# Patient Record
Sex: Male | Born: 1970 | Race: Black or African American | Hispanic: No | Marital: Single | Smoking: Never smoker
Health system: Southern US, Community
[De-identification: ages and names within clinical notes are randomized; demographics above are authoritative.]

## PROBLEM LIST (undated history)

## (undated) DIAGNOSIS — M109 Gout, unspecified: Secondary | ICD-10-CM

## (undated) DIAGNOSIS — R7303 Prediabetes: Secondary | ICD-10-CM

## (undated) DIAGNOSIS — I10 Essential (primary) hypertension: Secondary | ICD-10-CM

## (undated) DIAGNOSIS — N19 Unspecified kidney failure: Secondary | ICD-10-CM

## (undated) HISTORY — PX: COLONOSCOPY: SHX174

---

## 2013-12-23 ENCOUNTER — Encounter (HOSPITAL_COMMUNITY): Payer: Self-pay | Admitting: Family Medicine

## 2013-12-23 ENCOUNTER — Emergency Department (HOSPITAL_COMMUNITY)
Admission: EM | Admit: 2013-12-23 | Discharge: 2013-12-23 | Disposition: A | Discharge status: Home / Self Care | Payer: No Typology Code available for payment source | Source: Home / Self Care | Attending: Family Medicine | Admitting: Family Medicine

## 2013-12-23 DIAGNOSIS — I10 Essential (primary) hypertension: Secondary | ICD-10-CM

## 2013-12-23 DIAGNOSIS — M2540 Effusion, unspecified joint: Secondary | ICD-10-CM

## 2013-12-23 HISTORY — DX: Essential (primary) hypertension: I10

## 2013-12-23 LAB — COMPREHENSIVE METABOLIC PANEL
ALT: 55 U/L — AB (ref 0–53)
AST: 31 U/L (ref 0–37)
Albumin: 3.9 g/dL (ref 3.5–5.2)
Alkaline Phosphatase: 66 U/L (ref 39–117)
Anion gap: 17 — ABNORMAL HIGH (ref 5–15)
BILIRUBIN TOTAL: 0.5 mg/dL (ref 0.3–1.2)
BUN: 12 mg/dL (ref 6–23)
CHLORIDE: 97 meq/L (ref 96–112)
CO2: 22 mEq/L (ref 19–32)
Calcium: 9.7 mg/dL (ref 8.4–10.5)
Creatinine, Ser: 1.15 mg/dL (ref 0.50–1.35)
GFR, EST AFRICAN AMERICAN: 90 mL/min — AB (ref >90)
GFR, EST NON AFRICAN AMERICAN: 78 mL/min — AB (ref >90)
GLUCOSE: 101 mg/dL — AB (ref 70–99)
Potassium: 4.1 mEq/L (ref 3.7–5.3)
Sodium: 136 mEq/L — ABNORMAL LOW (ref 137–147)
Total Protein: 8.4 g/dL — ABNORMAL HIGH (ref 6.0–8.3)

## 2013-12-23 LAB — URIC ACID: Uric Acid, Serum: 9.4 mg/dL — ABNORMAL HIGH (ref 4.0–7.8)

## 2013-12-23 MED ORDER — PREDNISONE 50 MG PO TABS: ORAL_TABLET | ORAL | Status: DC

## 2013-12-23 MED ORDER — KETOROLAC TROMETHAMINE 60 MG/2ML IM SOLN
INTRAMUSCULAR | Status: AC
  Filled 2013-12-23: qty 2

## 2013-12-23 MED ORDER — KETOROLAC TROMETHAMINE 60 MG/2ML IM SOLN
60.0000 mg | Freq: Once | INTRAMUSCULAR | Status: AC
  Administered 2013-12-23: 60 mg via INTRAMUSCULAR

## 2013-12-23 MED ORDER — HYDROCHLOROTHIAZIDE 25 MG PO TABS: 25.0000 mg | ORAL_TABLET | Freq: Every day | ORAL | Status: DC

## 2013-12-23 NOTE — ED Provider Notes (Addendum)
CSN: [id number]     Arrival date & time 12/23/13  [id number] History   None    Chief Complaint  Patient presents with  . Joint Pain   (Consider location/radiation/quality/duration/timing/severity/associated sxs/prior Treatment) HPI  HTN: Denies CP, SOB, palpitations. Does not take any medications currently. Previously tried 3 different medications    R ankle pain: started 7 days ago. Getting worse. Off and on for several years. Various joints involved (typically wrists or ankles). Toes not involved. Ibuprofen 1200mg  w/ some benefit. Pt works as a Investment banker, operationalchef. Ice w/ benefit. Movement makes it worse.    Past Medical History  Diagnosis Date  . Hypertension    History reviewed. No pertinent past surgical history. History reviewed. No pertinent family history. History  Substance Use Topics  . Smoking status: Never Smoker   . Smokeless tobacco: Not on file  . Alcohol Use: Yes     Comment: occ    Review of Systems Per HPI with all other pertinent systems negative.    Allergies  Review of patient's allergies indicates no known allergies.  Home Medications   Prior to Admission medications   Medication Sig Start Date End Date Taking? Authorizing Provider  hydrochlorothiazide (HYDRODIURIL) 25 MG tablet Take 1 tablet (25 mg total) by mouth daily. 12/23/13   Ozella Rocksavid J Merrell, MD  predniSONE (DELTASONE) 50 MG tablet Take daily with breakfast 12/23/13   Ozella Rocksavid J Merrell, MD   BP 180/106  Pulse 96  Temp(Src) 98.4 F (36.9 C) (Oral)  Resp 18  SpO2 100% Physical Exam  Constitutional: He appears well-developed and well-nourished. No distress.  HENT:  Head: Normocephalic and atraumatic.  Eyes: Pupils are equal, round, and reactive to light.  Neck: Normal range of motion.  Cardiovascular: Normal rate, regular rhythm, normal heart sounds and intact distal pulses.  Exam reveals no gallop and no friction rub.   No murmur heard. Pulmonary/Chest: Effort normal and breath sounds normal. No respiratory  distress. He has no wheezes. He has no rales. He exhibits no tenderness.  Abdominal: Soft. He exhibits no distension.  Musculoskeletal: Normal range of motion.  R ankle swollen and warm to plapation. No fluid collection.   Neurological: He is alert. He exhibits normal muscle tone.  Skin: Skin is warm. No rash noted. He is not diaphoretic.  Psychiatric: He has a normal mood and affect. His behavior is normal. Judgment and thought content normal.    ED Course  Procedures (including critical care time) Labs Review Labs Reviewed  COMPREHENSIVE METABOLIC PANEL  URIC ACID    Imaging Review No results found.   MDM   1. Joint swelling   2. Essential hypertension    Intermittent joint swelling most concerning for gout. Unlikely PMR given locations, unlikely infectious or injury based. Toradol in office today Start 7 days of prednisone in am tomorrow. Favor prednisone over NSAIDs or colchicine given recent high dose of NSAIDs and possible renal implications along w/ HTN.   HTN: uncontrolled. Previously on medications. Sounds like a resistent pattern and will need multiple medications. CMET. Start HCTZ 25mg .   [facility] and discussed other clinic options.   Shelly Flattenavid Merrell, MD Family Medicine 12/23/2013, 8:54 AM      Ozella Rocksavid J Merrell, MD 12/23/13 [id number]  Ozella Rocksavid J Merrell, MD 12/23/13 2149

## 2013-12-23 NOTE — ED Notes (Signed)
C/o  Bilateral ankle, bilateral wrist swelling off/on.   Denies injury. 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    Pt has hx of hypertension but is noncompliant with medication.

## 2013-12-23 NOTE — Discharge Instructions (Signed)
Your joint pain is likely from gout. We will check blood work today to see if this is the cause of your pain The shot we gave you should help siginificantly Please start the prednisone tomorrow Please start the HCTZ for your blood pressure Please call the family medicine center to get an appointment The number is [phone]

## 2013-12-26 NOTE — ED Notes (Signed)
Uric acid 9.4 H.  Message sent to Dr. Konrad DoloresMerrell. Vassie MoselleYork,  M 12/26/2013

## 2013-12-27 ENCOUNTER — Telehealth (HOSPITAL_COMMUNITY): Payer: Self-pay | Admitting: *Deleted

## 2013-12-27 NOTE — ED Notes (Signed)
Dr. Konrad DoloresMerrell asked me to call pt. his results.  I called pt. Pt. verified x 2 and given results.  Pt. told he needs to get a PCP to follow him up for this. He may need medication to lower his uric acid level.  This would help prevent frequent attacks of gout.  Pt. Voiced understanding. Vassie MoselleYork,  M 12/27/2013

## 2014-04-20 ENCOUNTER — Other Ambulatory Visit: Payer: Self-pay | Admitting: Physician Assistant

## 2014-08-11 ENCOUNTER — Other Ambulatory Visit: Payer: Self-pay | Admitting: Physician Assistant

## 2015-08-16 DIAGNOSIS — M109 Gout, unspecified: Secondary | ICD-10-CM | POA: Insufficient documentation

## 2015-08-16 DIAGNOSIS — N183 Chronic kidney disease, stage 3 unspecified: Secondary | ICD-10-CM | POA: Insufficient documentation

## 2015-08-16 DIAGNOSIS — E669 Obesity, unspecified: Secondary | ICD-10-CM | POA: Insufficient documentation

## 2016-02-26 ENCOUNTER — Inpatient Hospital Stay (HOSPITAL_COMMUNITY)
Admission: EM | Admit: 2016-02-26 | Discharge: 2016-03-04 | DRG: 683 | Disposition: A | Discharge status: Home / Self Care | Payer: BLUE CROSS/BLUE SHIELD | Attending: Family Medicine | Admitting: Family Medicine

## 2016-02-26 ENCOUNTER — Encounter (HOSPITAL_COMMUNITY): Payer: Self-pay

## 2016-02-26 ENCOUNTER — Ambulatory Visit (HOSPITAL_COMMUNITY)
Admission: EM | Admit: 2016-02-26 | Discharge: 2016-02-26 | Disposition: A | Discharge status: Nursing Home (Includes ICF) | Payer: BLUE CROSS/BLUE SHIELD | Source: Home / Self Care | Attending: Family Medicine | Admitting: Family Medicine

## 2016-02-26 ENCOUNTER — Emergency Department (HOSPITAL_COMMUNITY): Payer: BLUE CROSS/BLUE SHIELD

## 2016-02-26 ENCOUNTER — Encounter (HOSPITAL_COMMUNITY): Payer: Self-pay | Admitting: Emergency Medicine

## 2016-02-26 ENCOUNTER — Ambulatory Visit (INDEPENDENT_AMBULATORY_CARE_PROVIDER_SITE_OTHER): Payer: BLUE CROSS/BLUE SHIELD

## 2016-02-26 DIAGNOSIS — Z95828 Presence of other vascular implants and grafts: Secondary | ICD-10-CM | POA: Diagnosis not present

## 2016-02-26 DIAGNOSIS — Z79899 Other long term (current) drug therapy: Secondary | ICD-10-CM | POA: Diagnosis not present

## 2016-02-26 DIAGNOSIS — N19 Unspecified kidney failure: Secondary | ICD-10-CM | POA: Diagnosis not present

## 2016-02-26 DIAGNOSIS — B1920 Unspecified viral hepatitis C without hepatic coma: Secondary | ICD-10-CM | POA: Diagnosis present

## 2016-02-26 DIAGNOSIS — R0682 Tachypnea, not elsewhere classified: Secondary | ICD-10-CM | POA: Diagnosis present

## 2016-02-26 DIAGNOSIS — M109 Gout, unspecified: Secondary | ICD-10-CM | POA: Diagnosis present

## 2016-02-26 DIAGNOSIS — R0600 Dyspnea, unspecified: Secondary | ICD-10-CM

## 2016-02-26 DIAGNOSIS — I120 Hypertensive chronic kidney disease with stage 5 chronic kidney disease or end stage renal disease: Secondary | ICD-10-CM | POA: Diagnosis present

## 2016-02-26 DIAGNOSIS — R918 Other nonspecific abnormal finding of lung field: Secondary | ICD-10-CM

## 2016-02-26 DIAGNOSIS — N186 End stage renal disease: Secondary | ICD-10-CM | POA: Diagnosis present

## 2016-02-26 DIAGNOSIS — E875 Hyperkalemia: Secondary | ICD-10-CM | POA: Diagnosis present

## 2016-02-26 DIAGNOSIS — N170 Acute kidney failure with tubular necrosis: Principal | ICD-10-CM | POA: Diagnosis present

## 2016-02-26 DIAGNOSIS — R9431 Abnormal electrocardiogram [ECG] [EKG]: Secondary | ICD-10-CM | POA: Diagnosis not present

## 2016-02-26 DIAGNOSIS — N179 Acute kidney failure, unspecified: Secondary | ICD-10-CM

## 2016-02-26 DIAGNOSIS — Z6830 Body mass index (BMI) 30.0-30.9, adult: Secondary | ICD-10-CM | POA: Diagnosis not present

## 2016-02-26 DIAGNOSIS — E669 Obesity, unspecified: Secondary | ICD-10-CM | POA: Diagnosis present

## 2016-02-26 DIAGNOSIS — E872 Acidosis, unspecified: Secondary | ICD-10-CM

## 2016-02-26 DIAGNOSIS — E1122 Type 2 diabetes mellitus with diabetic chronic kidney disease: Secondary | ICD-10-CM | POA: Diagnosis present

## 2016-02-26 DIAGNOSIS — B1910 Unspecified viral hepatitis B without hepatic coma: Secondary | ICD-10-CM | POA: Diagnosis present

## 2016-02-26 DIAGNOSIS — Z7952 Long term (current) use of systemic steroids: Secondary | ICD-10-CM | POA: Diagnosis not present

## 2016-02-26 DIAGNOSIS — R0602 Shortness of breath: Secondary | ICD-10-CM | POA: Diagnosis not present

## 2016-02-26 DIAGNOSIS — Z66 Do not resuscitate: Secondary | ICD-10-CM | POA: Diagnosis present

## 2016-02-26 DIAGNOSIS — E869 Volume depletion, unspecified: Secondary | ICD-10-CM | POA: Diagnosis present

## 2016-02-26 DIAGNOSIS — R0609 Other forms of dyspnea: Secondary | ICD-10-CM | POA: Diagnosis not present

## 2016-02-26 DIAGNOSIS — Z794 Long term (current) use of insulin: Secondary | ICD-10-CM | POA: Diagnosis not present

## 2016-02-26 HISTORY — DX: Gout, unspecified: M10.9

## 2016-02-26 LAB — BASIC METABOLIC PANEL
Anion gap: 38 — ABNORMAL HIGH (ref 5–15)
BUN: 269 mg/dL — AB (ref 6–20)
BUN: 271 mg/dL — ABNORMAL HIGH (ref 6–20)
CALCIUM: 6.4 mg/dL — AB (ref 8.9–10.3)
CO2: 7 mmol/L — ABNORMAL LOW (ref 22–32)
CREATININE: 21.04 mg/dL — AB (ref 0.61–1.24)
CREATININE: 22.07 mg/dL — AB (ref 0.61–1.24)
Calcium: 6.4 mg/dL — CL (ref 8.9–10.3)
Chloride: 85 mmol/L — ABNORMAL LOW (ref 101–111)
Chloride: 85 mmol/L — ABNORMAL LOW (ref 101–111)
GFR calc Af Amer: 3 mL/min — ABNORMAL LOW (ref >60)
GFR calc Af Amer: 2 mL/min — ABNORMAL LOW (ref >60)
GFR calc non Af Amer: 2 mL/min — ABNORMAL LOW (ref >60)
GFR, EST NON AFRICAN AMERICAN: 2 mL/min — AB (ref >60)
GLUCOSE: 158 mg/dL — AB (ref 65–99)
Glucose, Bld: 151 mg/dL — ABNORMAL HIGH (ref 65–99)
Potassium: 5.6 mmol/L — ABNORMAL HIGH (ref 3.5–5.1)
Potassium: 5.1 mmol/L (ref 3.5–5.1)
SODIUM: 130 mmol/L — AB (ref 135–145)
Sodium: 129 mmol/L — ABNORMAL LOW (ref 135–145)

## 2016-02-26 LAB — COMPREHENSIVE METABOLIC PANEL
ALT: 103 U/L — ABNORMAL HIGH (ref 17–63)
AST: 37 U/L (ref 15–41)
Albumin: 2.6 g/dL — ABNORMAL LOW (ref 3.5–5.0)
Alkaline Phosphatase: 64 U/L (ref 38–126)
BILIRUBIN TOTAL: 0.6 mg/dL (ref 0.3–1.2)
BUN: 272 mg/dL — AB (ref 6–20)
CO2: <7 mmol/L — ABNORMAL LOW (ref 22–32)
Calcium: 6.3 mg/dL — CL (ref 8.9–10.3)
Chloride: 88 mmol/L — ABNORMAL LOW (ref 101–111)
Creatinine, Ser: 21.8 mg/dL — ABNORMAL HIGH (ref 0.61–1.24)
GFR calc Af Amer: 3 mL/min — ABNORMAL LOW (ref >60)
GFR, EST NON AFRICAN AMERICAN: 2 mL/min — AB (ref >60)
Glucose, Bld: 104 mg/dL — ABNORMAL HIGH (ref 65–99)
POTASSIUM: 4.6 mmol/L (ref 3.5–5.1)
Sodium: 130 mmol/L — ABNORMAL LOW (ref 135–145)
TOTAL PROTEIN: 6.9 g/dL (ref 6.5–8.1)

## 2016-02-26 LAB — I-STAT TROPONIN, ED: TROPONIN I, POC: 0.03 ng/mL (ref 0.00–0.08)

## 2016-02-26 LAB — CBC
HCT: 34.6 % — ABNORMAL LOW (ref 39.0–52.0)
Hemoglobin: 11.1 g/dL — ABNORMAL LOW (ref 13.0–17.0)
MCH: 28.6 pg (ref 26.0–34.0)
MCHC: 32.1 g/dL (ref 30.0–36.0)
MCV: 89.2 fL (ref 78.0–100.0)
Platelets: 101 10*3/uL — ABNORMAL LOW (ref 150–400)
RBC: 3.88 MIL/uL — ABNORMAL LOW (ref 4.22–5.81)
RDW: 16.7 % — AB (ref 11.5–15.5)
WBC: 9 10*3/uL (ref 4.0–10.5)

## 2016-02-26 LAB — HEPATIC FUNCTION PANEL
ALT: 111 U/L — ABNORMAL HIGH (ref 17–63)
AST: 40 U/L (ref 15–41)
Albumin: 2.9 g/dL — ABNORMAL LOW (ref 3.5–5.0)
Alkaline Phosphatase: 72 U/L (ref 38–126)
BILIRUBIN DIRECT: 0.1 mg/dL (ref 0.1–0.5)
BILIRUBIN INDIRECT: 0.5 mg/dL (ref 0.3–0.9)
Total Bilirubin: 0.6 mg/dL (ref 0.3–1.2)
Total Protein: 7.8 g/dL (ref 6.5–8.1)

## 2016-02-26 LAB — I-STAT ARTERIAL BLOOD GAS, ED
Acid-base deficit: 21 mmol/L — ABNORMAL HIGH (ref 0.0–2.0)
Bicarbonate: 6.5 mmol/L — ABNORMAL LOW (ref 20.0–28.0)
O2 SAT: 81 %
PH ART: 7.114 — AB (ref 7.350–7.450)
TCO2: 7 mmol/L (ref 0–100)
pCO2 arterial: 20.4 mmHg — ABNORMAL LOW (ref 32.0–48.0)
pO2, Arterial: 58 mmHg — ABNORMAL LOW (ref 83.0–108.0)

## 2016-02-26 LAB — CK
CK TOTAL: 5585 U/L — AB (ref 49–397)
CK TOTAL: 5400 U/L — AB (ref 49–397)

## 2016-02-26 LAB — CBG MONITORING, ED
Glucose-Capillary: 137 mg/dL — ABNORMAL HIGH (ref 65–99)
Glucose-Capillary: 186 mg/dL — ABNORMAL HIGH (ref 65–99)

## 2016-02-26 LAB — MAGNESIUM: Magnesium: 3.2 mg/dL — ABNORMAL HIGH (ref 1.7–2.4)

## 2016-02-26 LAB — PHOSPHORUS: PHOSPHORUS: 19.8 mg/dL — AB (ref 2.5–4.6)

## 2016-02-26 LAB — LACTATE DEHYDROGENASE: LDH: 667 U/L — AB (ref 98–192)

## 2016-02-26 LAB — BRAIN NATRIURETIC PEPTIDE: B Natriuretic Peptide: 187.5 pg/mL — ABNORMAL HIGH (ref 0.0–100.0)

## 2016-02-26 MED ORDER — SODIUM BICARBONATE 8.4 % IV SOLN
50.0000 meq | Freq: Once | INTRAVENOUS | Status: AC
  Administered 2016-02-26: 50 meq via INTRAVENOUS
  Filled 2016-02-26: qty 50

## 2016-02-26 MED ORDER — FUROSEMIDE 10 MG/ML IJ SOLN
160.0000 mg | Freq: Once | INTRAVENOUS | Status: DC
  Filled 2016-02-26: qty 16

## 2016-02-26 MED ORDER — INSULIN ASPART 100 UNIT/ML IV SOLN
10.0000 [IU] | Freq: Once | INTRAVENOUS | Status: AC
  Administered 2016-02-26: 10 [IU] via INTRAVENOUS
  Filled 2016-02-26: qty 1

## 2016-02-26 MED ORDER — SODIUM CHLORIDE 0.9 % IV BOLUS (SEPSIS)
1000.0000 mL | Freq: Once | INTRAVENOUS | Status: AC
  Administered 2016-02-26: 1000 mL via INTRAVENOUS

## 2016-02-26 MED ORDER — DEXTROSE 50 % IV SOLN
50.0000 mL | Freq: Once | INTRAVENOUS | Status: AC
  Administered 2016-02-26: 50 mL via INTRAVENOUS
  Filled 2016-02-26: qty 50

## 2016-02-26 MED ORDER — LIDOCAINE HCL (PF) 1 % IJ SOLN
INTRAMUSCULAR | Status: AC
  Administered 2016-02-26: 30 mL
  Filled 2016-02-26: qty 30

## 2016-02-26 MED ORDER — RENA-VITE PO TABS
1.0000 | ORAL_TABLET | Freq: Every day | ORAL | Status: DC
  Administered 2016-02-27 – 2016-03-03 (×5): 1 via ORAL
  Filled 2016-02-26 (×8): qty 1

## 2016-02-26 MED ORDER — CALCIUM GLUCONATE 10 % IV SOLN
1.0000 g | Freq: Once | INTRAVENOUS | Status: AC
  Administered 2016-02-26: 1 g via INTRAVENOUS
  Filled 2016-02-26: qty 10

## 2016-02-26 NOTE — ED Notes (Signed)
Admitting MD and nephrologist at bedside 

## 2016-02-26 NOTE — ED Notes (Signed)
Pt to dialysis at this time

## 2016-02-26 NOTE — H&P (Signed)
Family Medicine Teaching [facility] Admission History and Physical Service Pager: [phone]  Patient name: Harold Rodriguez Medical record number: [id number] Date of birth: Mar 29, 1971 Age: 45 y.o. Gender: male  Primary Care Provider: [facility] Consultants: nephrology Code Status: DNR  Chief Complaint: dyspnea   Assessment and Plan: Harold Rodriguez is a 45 y.o. male presenting with dysnpea, metabolic acidosis, and acute renal failure. PMH is significant for HTN, gout.   Acute Renal Failure: Cr 21.04 on admission. Phosphorus 19.8. Care everywhere Cr 09/2015 1.25. Past medical history only significant for HTN and gout. Pt reports SBPs in 180s when he checks, but this is quite a rapid change in kidney function from only HTN. Patient was unable to name his blood pressure medications, raising concern about adherence. Extensive family history of dialysis on paternal side, which sister reports is due to diabetes. No personal or family history of autoimmune disease like lupus, sarcoid, or others, although these are certainly on the differential as well. Has reported joint aches, mostly in elbows. Denies drug use, regular NSAID use, and taking supplements. Denies any recent increase in exercise, which could be concerning for rhabdomyolysis causing renal failure. Bladder scan for only 20cc of urine in ED (although patient reports making urine at home), making obstruction at level of bladder unlikely. Patient also denies seeing any blood in urine.  -admit to stepdown, Dr. Randolm Idol attending -nephrology following, appreciate recs: emergent dialysis tonight, likely HD tomorrow as well. Will place fem cath tonight. May need renal biopsy. Obtain renal U/S.  -laboratory workup per nephro includes: C3 and C4 complement, anca, ANA, glomerular basement membrane antibodies, HIV, Hepatitis B and C antibodies, protein electrophoresis, peripheral smear, haptoglobin, LDH.  -trend  CMPs -Obtain UA and Urine protein: Cr -Obtain CK  Shortness of breath:  No history of lung disease. Presented to PCP on 9/19 with this concern, was given prednisone. Given metabolic acidosis in setting of acute kidney failure, patient is likely tachypneic due to excessive acid (BUN 272 on CMP; ABG pH 7.114, pCO2 20.4, bicarb 6.5). Saturating 100% on room air. CXR with scattered peribronchial nodular and ground-glass opacities, more prominent in the right lung. Tachynpea likely caused by acidosis, other possible etiologies include sarcoidosis, pneuomonitis, or pulmonary edema (although not volume overloaded on exam).  -continue to monitor after HD; continue aggressive treatment of uremia -s/p 1 amp bicarb in ED  HTN: Normotensive to slightly hypertensive on admission. Home meds include norvasc and HCTZ.  -PRN hydralazine SBP>160 / DBP >110  Gout: Patient unable to give history of most recent flair. No signs of acute flair at present. Patient reports compliance with this medication. -consider restarting home allopurinol   FEN/GI: renal diet Prophylaxis: lovenox  Disposition: admission to stepdown  History of Present Illness:  Harold Rodriguez is a 45 y.o. male presenting with shortness of breath.   This began 2 weeks ago, he has been generally feeling unwell. Unable to specify further, no sites of pain. Over the past week, this has worsened with patient unable to take anything by mouth very well and having intermittent nausea. Shortness of breath since at least 9/19, when he went to his PCP and was given prednisone, and CBC and chest x-ray were done. Return to urgent care today due to shortness of breath; staff concerned due to tachypnea, so patient was transferred to ED via EMS. Extensive family history of renal failure, patient's sister reports this is due to diabetes. Patient reports extent of medical history  to be hypertension and gout. He reports that his systolic blood pressures have been in  the 180s when he has checked them recently. He is supposed to be on Norvasc and HCTZ, although it's unclear whether the patient is compliant with these or confused about his regimen. Works for [employer], does do manual heavy lifting, no more recently than previous. No new strenuous exercise routines, no new supplements or over-the-counter medicines. Denies fever.  In the ED, hyperkalemic to 5.6, with acute renal failure as evidenced by creatinine of 21.04, hypocalcemia to 6.4. Anion gap 38. I-STAT ABG consistent with compensated metabolic acidosis pH 7.11, PCO2 20.4, bicarbonate 6.5. Troponin normal at 0.03. CK ordered. Patient given calcium gluconate and insulin for hyperkalemia; ED provider noted peak T waves and marginally widened QRS. Potassium on repeat was 5.1. Creatinine continued to worsen at 22.07. Nephrology was consulted from the emergency room; emergent dialysis scheduled for this evening and nephrology team to place Fem Cath. One amp of bicarbonate given in the ED.  On extensive discussion with the patient, he elects to be DO NOT RESUSCITATE. He understands the consequences of this decision, he reports that he does not want to be resuscitated if he is going to be very sick. He understands that any interventions would be temporary, and he says he would not want these. Family is in agreement that this is consistent with his previous views.    Review Of Systems: Per HPI with the following additions: none.  ROS: No chest pain, no myalgias, does endorse nausea without emesis, no diarrhea, no fevers or chills, endorses generalized weakness  There are no active problems to display for this patient.  Past Medical History: Past Medical History:  Diagnosis Date  . Gout   . Hypertension     Past Surgical History: History reviewed. No pertinent surgical history.  Social History: Social History  Substance Use Topics  . Smoking status: Never Smoker  . Smokeless tobacco: Never Used  . Alcohol  use Yes     Comment: occ   Additional social history: Lives with father. Mother, sister, niece present. Drinks alcohol occasionally (social).  Please also refer to relevant sections of EMR.  Family History: History reviewed. No pertinent family history. Extensive history of dialysis on paternal side, both male and male relatives (uncles and 1 aunt).  Allergies and Medications: No Known Allergies No current facility-administered medications on file prior to encounter.    Current Outpatient Prescriptions on File Prior to Encounter  Medication Sig Dispense Refill  . hydrochlorothiazide (HYDRODIURIL) 25 MG tablet Take 1 tablet (25 mg total) by mouth daily. 30 tablet 0  . predniSONE (DELTASONE) 50 MG tablet Take daily with breakfast 7 tablet 0    Objective: BP (!) 139/54 (BP Location: Right Arm)   Pulse 78   Temp 97.6 F (36.4 C) (Oral)   SpO2 100%  Exam: General: Overweight male lying in bed, tachypneic. Eyes: EOMI, no icterus.  ENTM: moist mucous membranes, fair dentition Neck: supple, no JVD Cardiovascular: rrr, no m/r/g, no edema Respiratory: CTAB, good air movement, tachypneic without increased work of breathing Gastrointestinal: SNTND, +BS MSK: strength 5/5 in all extremities, no pitting edema of LE Derm: no rashes on exposed skin, no wounds Neuro: CN II-XII grossly intact, sensation intact; AOx2.5 (person, place, month but not year -- 2024).  Psych: mood and affect appropriate  Labs and Imaging: CBC BMET   Recent Labs Lab 02/26/16 1446  WBC 9.0  HGB 11.1*  HCT 34.6*  PLT 101*  Recent Labs Lab 02/26/16 1847  NA 129*  K 5.1  CL 85*  CO2 <7*  BUN 271*  CREATININE 22.07*  GLUCOSE 158*  CALCIUM 6.4*     Dg Chest 2 View  Result Date: 02/26/2016 CLINICAL DATA:  Weakness, fatigue, fever and shortness of breath for 1 week. EXAM: CHEST  2 VIEW COMPARISON:  None. FINDINGS: Cardiomediastinal silhouette is normal. Mediastinal contours appear intact. There is  no evidence of pleural effusion or pneumothorax. Scattered peribronchial nodular and ground-glass opacities are seen throughout both lungs, more prominent on the right. Osseous structures are without acute abnormality. Soft tissues are grossly normal. IMPRESSION: Scattered peribronchial nodular and ground-glass opacities, more prominent in the right lung. Differential diagnosis includes small airway disease, acute pneumonitis or less likely development of interstitial pulmonary edema. Electronically Signed   By: Ted Mcalpineobrinka  Dimitrova M.D.   On: 02/26/2016 13:54   Garth BignessKathryn Timberlake, MD 02/26/2016, 8:13 PM PGY-1, [facility] FPTS Intern pager: [phone], text pages welcome  Upper Level Addendum:  I have seen and evaluated this patient along with Dr. Chanetta Marshallimberlake and reviewed the above note, making necessary revisions in purple.   Dani GobbleHillary , MD PGY-2, [facility] 02/26/2016 11:34 PM

## 2016-02-26 NOTE — ED Notes (Signed)
Pt is going to the ED via POV.  Pt has had a few episodes of confusion and/or disorientation at home and here at the UC.  Pt's breathing is shallow and rapid, interrupted by hiccups.  I counted 25 breaths in a full minute, with many hiccups in between.  Pt is stable for transport by POV with his sister.  Report was called to the First RN, Shanda BumpsJessica in the ED.  I requested that the pt not be left to sit in the lobby for an extended period of time.

## 2016-02-26 NOTE — ED Notes (Signed)
ABG results given to Dr. Ranae PalmsYelverton.  No interventions at this time.

## 2016-02-26 NOTE — ED Notes (Signed)
CRITICAL VALUE ALERT  Critical value received:  Calcium 6.4  Date of notification: 02/26/2016  Time of notification:  1550  Critical value read back:Yes.    Nurse who received alert:  Heide GuileHope   MD notified (1st page):  Dr. Fayrene FearingJames 1551  No further orders at this time

## 2016-02-26 NOTE — Consult Note (Signed)
Harold Rodriguez is an 45 y.o. male referred by Dr Kittie Plater   Chief Complaint: SOB, weak, poor appetite HPI: 45yo BM with hx of HTN and gout presents to ER after 2 weeks of fatigue, poor appetite and within the past week SOB.  He denies any hx of renal disease and Scr was 1.2 in 4/17 but BUN/Cr in ER is 269/21.  No gross hematuria, no stones, no NSAIDs and no obstructive sxs.  Denies use of any recreational drugs.  Takes HCTZ, amlodipine and ? other drug for HTN.  Says SBP has been in the 180's recently.  Still passing urine and has not noticed any decrease in amount.  Bladder scan in ER showed only 20cc urine.  Mild pain in elbows but otherwise denies systemic sxs.  Hg 11 and plt ct sl low at 101,000.   Past Medical History:  Diagnosis Date  . Gout   . Hypertension    FH pos for ESRD in several uncles and 1 aunt all from DM per his sister  History reviewed. No pertinent surgical history.  History reviewed. No pertinent family history. Social History:  reports that he has never smoked. He has never used smokeless tobacco. He reports that he drinks alcohol. He reports that he does not use drugs.  Lives with Father.  Denies drug use.  Works for [employer].  Allergies: No Known Allergies   (Not in a hospital admission)   Lab Results: UA: ND  Recent Labs  02/26/16 1446  WBC 9.0  HGB 11.1*  HCT 34.6*  PLT 101*   BMET  Recent Labs  02/26/16 1446  NA 130*  K 5.6*  CL 85*  CO2 7*  GLUCOSE 151*  BUN 269*  CREATININE 21.04*  CALCIUM 6.4*   LFT No results for input(s): PROT, ALBUMIN, AST, ALT, ALKPHOS, BILITOT, BILIDIR, IBILI in the last 72 hours. Dg Chest 2 View  Result Date: 02/26/2016 CLINICAL DATA:  Weakness, fatigue, fever and shortness of breath for 1 week. EXAM: CHEST  2 VIEW COMPARISON:  None. FINDINGS: Cardiomediastinal silhouette is normal. Mediastinal contours appear intact. There is no evidence of pleural effusion or pneumothorax. Scattered peribronchial nodular and  ground-glass opacities are seen throughout both lungs, more prominent on the right. Osseous structures are without acute abnormality. Soft tissues are grossly normal. IMPRESSION: Scattered peribronchial nodular and ground-glass opacities, more prominent in the right lung. Differential diagnosis includes small airway disease, acute pneumonitis or less likely development of interstitial pulmonary edema. Electronically Signed   By: Fidela Salisbury M.D.   On: 02/26/2016 13:54    ROS: No change in vision + sob No CP No abd pain Mild elbow pain No change in BM, no melena or hematochezia No neuropathic sx  PHYSICAL EXAM: Blood pressure (!) 111/41, pulse 72, temperature 97.6 F (36.4 C), temperature source Oral, resp. rate 26, height _0  (1.88 m), weight 105.2 kg (232 lb), SpO2 100 %. HEENT: PERRLA EOMI Non icteric.  Fundi mild art narrowing NECK:No JVD No bruits LUNGS:Few very faint crackles CARDIAC:RRR with 1/6 systolic M No rub ABD:+ BS NTND No HSM EXT:No C,C,E NEURO:CNI, Ox3  M&SI, + asterixis  Assessment: 1. Uremia 2. Acute/Subacute renal failure of ? Etiology 3. Hyperkalemia 4. Met acidosis 5. HTN 6. Hx gout  PLAN: 1. Emergent HD to help correct hyperkalemia and acidosis 2. Complete serologic WU 3. Renal US 4. May need renal bx 5. Plan HD again tomorrow.  Will need permcath after uremia treated   , T  02/26/2016, 7:45 PM

## 2016-02-26 NOTE — ED Provider Notes (Signed)
[facility]    CSN: [id number] Arrival date & time: 02/26/16  1256  First Provider Contact:  First MD Initiated Contact with Patient 02/26/16 1338        History   Chief Complaint Chief Complaint  Patient presents with  . Fatigue  . Shortness of Breath    HPI Harold Rodriguez is a 45 y.o. male.   The history is provided by the patient.  Shortness of Breath  Severity:  Moderate Onset quality:  Gradual Duration:  6 days Progression:  Worsening Chronicity:  New Relieved by:  Nothing Associated symptoms: cough   Associated symptoms: no abdominal pain, no fever and no wheezing     Past Medical History:  Diagnosis Date  . Gout   . Hypertension     There are no active problems to display for this patient.   History reviewed. No pertinent surgical history.     Home Medications    Prior to Admission medications   Medication Sig Start Date End Date Taking? Authorizing Provider  hydrochlorothiazide (HYDRODIURIL) 25 MG tablet Take 1 tablet (25 mg total) by mouth daily. 12/23/13  Yes Ozella Rocksavid J Merrell, MD  predniSONE (DELTASONE) 50 MG tablet Take daily with breakfast 12/23/13   Ozella Rocksavid J Merrell, MD    Family History History reviewed. No pertinent family history.  Social History Social History  Substance Use Topics  . Smoking status: Never Smoker  . Smokeless tobacco: Never Used  . Alcohol use Yes     Comment: occ     Allergies   Review of patient's allergies indicates no known allergies.   Review of Systems Review of Systems  Constitutional: Positive for appetite change and fatigue. Negative for fever.  HENT: Negative.   Respiratory: Positive for cough and shortness of breath. Negative for wheezing.   Cardiovascular: Negative.   Gastrointestinal: Negative.  Negative for abdominal pain.  Genitourinary: Negative.   Psychiatric/Behavioral: Positive for confusion.     Physical Exam Triage Vital Signs ED Triage Vitals [02/26/16 1333]  Enc  Vitals Group     BP (!) 122/39     Pulse Rate 78     Resp      Temp 97.6 F (36.4 C)     Temp Source Oral     SpO2 100 %     Weight      Height      Head Circumference      Peak Flow      Pain Score 10     Pain Loc      Pain Edu?      Excl. in GC?    No data found.   Updated Vital Signs BP (!) 139/54 (BP Location: Right Arm)   Pulse 78   Temp 97.6 F (36.4 C) (Oral)   SpO2 100%   Visual Acuity Right Eye Distance:   Left Eye Distance:   Bilateral Distance:    Right Eye Near:   Left Eye Near:    Bilateral Near:     Physical Exam  Constitutional: He is oriented to person, place, and time. He appears well-developed and well-nourished.  HENT:  Right Ear: External ear normal.  Left Ear: External ear normal.  Mouth/Throat: Oropharynx is clear and moist.  Neck: Normal range of motion. Neck supple.  Cardiovascular: Normal rate, regular rhythm, normal heart sounds and intact distal pulses.   Pulmonary/Chest: Effort normal. He has rales.  Abdominal: Soft. Bowel sounds are normal.  Lymphadenopathy:    He has  no cervical adenopathy.  Neurological: He is alert and oriented to person, place, and time.  Skin: Skin is warm and dry.  Nursing note and vitals reviewed.    UC Treatments / Results  Labs (all labs ordered are listed, but only abnormal results are displayed) Labs Reviewed - No data to display  EKG  EKG Interpretation None       Radiology Dg Chest 2 View  Result Date: 02/26/2016 CLINICAL DATA:  Weakness, fatigue, fever and shortness of breath for 1 week. EXAM: CHEST  2 VIEW COMPARISON:  None. FINDINGS: Cardiomediastinal silhouette is normal. Mediastinal contours appear intact. There is no evidence of pleural effusion or pneumothorax. Scattered peribronchial nodular and ground-glass opacities are seen throughout both lungs, more prominent on the right. Osseous structures are without acute abnormality. Soft tissues are grossly normal. IMPRESSION: Scattered  peribronchial nodular and ground-glass opacities, more prominent in the right lung. Differential diagnosis includes small airway disease, acute pneumonitis or less likely development of interstitial pulmonary edema. Electronically Signed   By: Ted Mcalpine M.D.   On: 02/26/2016 13:54   X-rays reviewed and report per radiologist.  Procedures Procedures (including critical care time)  Medications Ordered in UC Medications - No data to display   Initial Impression / Assessment and Plan / UC Course  I have reviewed the triage vital signs and the nursing notes.  Pertinent labs & imaging results that were available during my care of the patient were reviewed by me and considered in my medical decision making (see chart for details).  Clinical Course   Sent for epsodes of confusion,fatigue and sob with assoc abnl cxr.  Final Clinical Impressions(s) / UC Diagnoses   Final diagnoses:  Dyspnea    New Prescriptions New Prescriptions   No medications on file     Linna Hoff, MD 02/26/16 1407

## 2016-02-26 NOTE — ED Notes (Signed)
Patient seemed to be very delirious at times while taking his chest xray, seemed uneasy and very weak.

## 2016-02-26 NOTE — ED Notes (Addendum)
Consent signed by pt after procedure explained by Dr. Briant CedarMattingly

## 2016-02-26 NOTE — ED Provider Notes (Signed)
[facility]-EMERGENCY DEPT Provider Note   CSN: [id number] Arrival date & time: 02/26/16  1422     History   Chief Complaint Chief Complaint  Patient presents with  . Fatigue  . Generalized Body Aches    HPI Harold Rodriguez is a 45 y.o. male.  HPI Patient presents with one week of fatigue and intermittent confusion. Disease had increased dyspnea especially with exertion and lying flat. No new lower extremity swelling. No chest pain. States he is making urine. Several days ago describes the urine as very dark in color. No history of any kidney disorder. Past Medical History:  Diagnosis Date  . Gout   . Hypertension     Patient Active Problem List   Diagnosis Date Noted  . Dyspnea   . Acute renal failure (HCC)   . Metabolic acidosis   . Opacity of lung on imaging study   . Presence of permanent central venous catheter   . Uremia   . Acute kidney failure (HCC) 02/26/2016    History reviewed. No pertinent surgical history.     Home Medications    Prior to Admission medications   Medication Sig Start Date End Date Taking? Authorizing Provider  allopurinol (ZYLOPRIM) 100 MG tablet Take 100 mg by mouth 2 (two) times daily.   Yes Historical Provider, MD  amLODipine (NORVASC) 5 MG tablet Take 5 mg by mouth daily.   Yes Historical Provider, MD  ibuprofen (ADVIL,MOTRIN) 200 MG tablet Take 600 mg by mouth every 6 (six) hours as needed (pain).   Yes Historical Provider, MD  losartan-hydrochlorothiazide (HYZAAR) 100-25 MG tablet Take 1 tablet by mouth daily. 09/07/15  Yes Historical Provider, MD  predniSONE (DELTASONE) 10 MG tablet Take 10-30 mg by mouth See admin instructions. 30 mg once a day for 3 days then 20 mg once a day for 3 days then 10 mg once a day for 3 days 02/20/16  Yes Historical Provider, MD  hydrochlorothiazide (HYDRODIURIL) 25 MG tablet Take 1 tablet (25 mg total) by mouth daily. Patient not taking: Reported on 02/27/2016 12/23/13   Ozella Rocks, MD  predniSONE  (DELTASONE) 50 MG tablet Take daily with breakfast Patient not taking: Reported on 02/27/2016 12/23/13   Ozella Rocks, MD    Family History History reviewed. No pertinent family history.  Social History Social History  Substance Use Topics  . Smoking status: Never Smoker  . Smokeless tobacco: Never Used  . Alcohol use Yes     Comment: occ     Allergies   Review of patient's allergies indicates no known allergies.   Review of Systems Review of Systems  Constitutional: Positive for chills and fatigue. Negative for fever.  Respiratory: Positive for cough and shortness of breath. Negative for wheezing.   Cardiovascular: Negative for chest pain, palpitations and leg swelling.  Gastrointestinal: Negative for abdominal pain, diarrhea, nausea and vomiting.  Genitourinary: Negative for dysuria, flank pain, frequency and hematuria.  Musculoskeletal: Negative for back pain, neck pain and neck stiffness.  Skin: Negative for rash and wound.  Neurological: Positive for dizziness and weakness (generalized). Negative for numbness and headaches.  Psychiatric/Behavioral: Positive for confusion.  All other systems reviewed and are negative.    Physical Exam Updated Vital Signs BP (!) 155/83 (BP Location: Left Arm)   Pulse 98   Temp 98.5 F (36.9 C) (Oral)   Resp (!) 23   Ht 6\' 2"  (1.88 m)   Wt 233 lb 14.5 oz (106.1 kg)   SpO2 97%  BMI 30.03 kg/m   Physical Exam  Constitutional: He is oriented to person, place, and time. He appears well-developed and well-nourished.  HENT:  Head: Normocephalic and atraumatic.  Mouth/Throat: Oropharynx is clear and moist.  Dry lips  Eyes: EOM are normal. Pupils are equal, round, and reactive to light.  Neck: Normal range of motion. Neck supple. No JVD present.  No meningismus  Cardiovascular: Normal rate and regular rhythm.  Exam reveals no gallop and no friction rub.   No murmur heard. Pulmonary/Chest: No respiratory distress. He has no  wheezes. He has rales. He exhibits no tenderness.  Tachypnea. Few crackles in bilateral bases.  Abdominal: Soft. Bowel sounds are normal. He exhibits no mass. There is tenderness (mild diffuse abdominal tenderness without focality.). There is no rebound and no guarding.  Musculoskeletal: Normal range of motion. He exhibits no edema or tenderness.  No CVA tenderness. No lower extremity swelling, asymmetry or tenderness.  Neurological: He is alert and oriented to person, place, and time.  Patient is confused. Does not know the date. Follows commands. 5/5 motor in all extremities. Sensation is intact.  Skin: Skin is warm and dry. Capillary refill takes less than 2 seconds. No rash noted. No erythema.  Psychiatric: He has a normal mood and affect. His behavior is normal.  Nursing note and vitals reviewed.    ED Treatments / Results  Labs (all labs ordered are listed, but only abnormal results are displayed) Labs Reviewed  BASIC METABOLIC PANEL - Abnormal; Notable for the following:       Result Value   Sodium 130 (*)    Potassium 5.6 (*)    Chloride 85 (*)    CO2 7 (*)    Glucose, Bld 151 (*)    BUN 269 (*)    Creatinine, Ser 21.04 (*)    Calcium 6.4 (*)    GFR calc non Af Amer 2 (*)    GFR calc Af Amer 3 (*)    Anion gap 38 (*)    All other components within normal limits  CBC - Abnormal; Notable for the following:    RBC 3.88 (*)    Hemoglobin 11.1 (*)    HCT 34.6 (*)    RDW 16.7 (*)    Platelets 101 (*)    All other components within normal limits  HEPATIC FUNCTION PANEL - Abnormal; Notable for the following:    Albumin 2.9 (*)    ALT 111 (*)    All other components within normal limits  BRAIN NATRIURETIC PEPTIDE - Abnormal; Notable for the following:    B Natriuretic Peptide 187.5 (*)    All other components within normal limits  CK - Abnormal; Notable for the following:    Total CK 5,585 (*)    All other components within normal limits  BASIC METABOLIC PANEL -  Abnormal; Notable for the following:    Sodium 129 (*)    Chloride 85 (*)    CO2 <7 (*)    Glucose, Bld 158 (*)    BUN 271 (*)    Creatinine, Ser 22.07 (*)    Calcium 6.4 (*)    GFR calc non Af Amer 2 (*)    GFR calc Af Amer 2 (*)    All other components within normal limits  COMPREHENSIVE METABOLIC PANEL - Abnormal; Notable for the following:    Sodium 130 (*)    Chloride 88 (*)    CO2 <7 (*)    Glucose, Bld 104 (*)  BUN 272 (*)    Creatinine, Ser 21.80 (*)    Calcium 6.3 (*)    Albumin 2.6 (*)    ALT 103 (*)    GFR calc non Af Amer 2 (*)    GFR calc Af Amer 3 (*)    All other components within normal limits  PHOSPHORUS - Abnormal; Notable for the following:    Phosphorus 19.8 (*)    All other components within normal limits  MAGNESIUM - Abnormal; Notable for the following:    Magnesium 3.2 (*)    All other components within normal limits  LACTATE DEHYDROGENASE - Abnormal; Notable for the following:    LDH 667 (*)    All other components within normal limits  CK - Abnormal; Notable for the following:    Total CK 5,400 (*)    All other components within normal limits  RENAL FUNCTION PANEL - Abnormal; Notable for the following:    Sodium 134 (*)    Potassium 3.3 (*)    Chloride 94 (*)    CO2 11 (*)    Glucose, Bld 116 (*)    BUN 206 (*)    Creatinine, Ser 15.72 (*)    Calcium 7.7 (*)    Phosphorus 11.6 (*)    Albumin 2.6 (*)    GFR calc non Af Amer 3 (*)    GFR calc Af Amer 4 (*)    Anion gap 29 (*)    All other components within normal limits  CBC WITH DIFFERENTIAL/PLATELET - Abnormal; Notable for the following:    WBC 27.5 (*)    HCT 37.2 (*)    MCV 69.7 (*)    MCH 25.3 (*)    MCHC 36.3 (*)    RDW 16.8 (*)    Platelets 401 (*)    Neutro Abs 24.2 (*)    Monocytes Absolute 2.5 (*)    All other components within normal limits  URINALYSIS, ROUTINE W REFLEX MICROSCOPIC (NOT AT [facility]) - Abnormal; Notable for the following:    APPearance CLOUDY (*)    Hgb  urine dipstick MODERATE (*)    Protein, ur 30 (*)    All other components within normal limits  PROTEIN / CREATININE RATIO, URINE - Abnormal; Notable for the following:    Protein Creatinine Ratio 0.26 (*)    All other components within normal limits  URINE MICROSCOPIC-ADD ON - Abnormal; Notable for the following:    Squamous Epithelial / LPF 0-5 (*)    Bacteria, UA FEW (*)    Casts HYALINE CASTS (*)    All other components within normal limits  RENAL FUNCTION PANEL - Abnormal; Notable for the following:    Sodium 134 (*)    Potassium 3.3 (*)    Chloride 96 (*)    CO2 15 (*)    Glucose, Bld 166 (*)    BUN 161 (*)    Creatinine, Ser 11.57 (*)    Calcium 7.8 (*)    Phosphorus 8.5 (*)    Albumin 2.8 (*)    GFR calc non Af Amer 5 (*)    GFR calc Af Amer 5 (*)    Anion gap 23 (*)    All other components within normal limits  CBC - Abnormal; Notable for the following:    WBC 28.7 (*)    HCT 37.9 (*)    MCV 69.8 (*)    MCH 25.8 (*)    MCHC 36.9 (*)    RDW 16.8 (*)  Platelets 406 (*)    All other components within normal limits  CBG MONITORING, ED - Abnormal; Notable for the following:    Glucose-Capillary 137 (*)    All other components within normal limits  I-STAT ARTERIAL BLOOD GAS, ED - Abnormal; Notable for the following:    pH, Arterial 7.114 (*)    pCO2 arterial 20.4 (*)    pO2, Arterial 58.0 (*)    Bicarbonate 6.5 (*)    Acid-base deficit 21.0 (*)    All other components within normal limits  CBG MONITORING, ED - Abnormal; Notable for the following:    Glucose-Capillary 186 (*)    All other components within normal limits  MRSA PCR SCREENING  CULTURE, BLOOD (ROUTINE X 2)  CULTURE, BLOOD (ROUTINE X 2)  HIV ANTIBODY (ROUTINE TESTING)  PATHOLOGIST SMEAR REVIEW  URINE RAPID DRUG SCREEN, HOSP PERFORMED  BLOOD GAS, ARTERIAL  C4 COMPLEMENT  C3 COMPLEMENT  MPO/PR-3 (ANCA) ANTIBODIES  ANTINUCLEAR ANTIBODIES, IFA  GLOMERULAR BASEMENT MEMBRANE ANTIBODIES  HEPATITIS  B SURFACE ANTIGEN  HEPATITIS C ANTIBODY (REFLEX)  PROTEIN ELECTROPHORESIS, SERUM  HAPTOGLOBIN  CBC WITH DIFFERENTIAL/PLATELET  HEMOGLOBIN A1C  HEPATITIS B SURFACE ANTIBODY  HEPATITIS B CORE ANTIBODY, TOTAL  PHOSPHORUS  CK  CBC  COMPREHENSIVE METABOLIC PANEL  I-STAT TROPOININ, ED    EKG  EKG Interpretation  Date/Time:  Monday February 26 2016 14:24:58 EDT Ventricular Rate:  72 PR Interval:  204 QRS Duration: 94 QT Interval:  450 QTC Calculation: 492 R Axis:   90 Text Interpretation:  Normal sinus rhythm Rightward axis Septal infarct , age undetermined Abnormal ECG Confirmed by Ranae Palms  MD,  ([id number]) on 02/26/2016 6:18:06 PM       Radiology Dg Chest 2 View  Result Date: 02/26/2016 CLINICAL DATA:  Weakness, fatigue, fever and shortness of breath for 1 week. EXAM: CHEST  2 VIEW COMPARISON:  None. FINDINGS: Cardiomediastinal silhouette is normal. Mediastinal contours appear intact. There is no evidence of pleural effusion or pneumothorax. Scattered peribronchial nodular and ground-glass opacities are seen throughout both lungs, more prominent on the right. Osseous structures are without acute abnormality. Soft tissues are grossly normal. IMPRESSION: Scattered peribronchial nodular and ground-glass opacities, more prominent in the right lung. Differential diagnosis includes small airway disease, acute pneumonitis or less likely development of interstitial pulmonary edema. Electronically Signed   By: Ted Mcalpine M.D.   On: 02/26/2016 13:54   Ct Chest Wo Contrast  Result Date: 02/27/2016 CLINICAL DATA:  45 year old male with pulmonary opacities seen on recent chest radiographs. Nonsmoker. Denies pain nor dyspnea. Uncontrollable hiccups intermittently. EXAM: CT CHEST WITHOUT CONTRAST TECHNIQUE: Multidetector CT imaging of the chest was performed following the standard protocol without IV contrast. COMPARISON:  Chest radiographs dating back through 02/26/2016 FINDINGS:  Cardiovascular: Top normal size cardiac chambers. No pericardial effusion nor significant pericardial thickening. Right IJ catheter terminates in the distal SVC. Mediastinum/Nodes: Small 1 cm or less in short axis paratracheal and right hilar lymph nodes. No axillary or supraclavicular adenopathy. Lungs/Pleura: Right upper lobe and right middle lobe confluent and ground-glass airspace opacities some of which demonstrate air bronchograms and do not violate pleural boundaries. Findings are likely infectious in etiology. No pleural effusion or pneumothorax. Upper Abdomen: Negative Musculoskeletal: Bilateral gynecomastia. Respiratory motion artifacts account for appearance of a lower sternal fracture on the sagittal re-formatted images. No acute osseous abnormality or is noted. IMPRESSION: Confluent right upper and middle lobe pulmonary consolidations and ground-glass opacities with air bronchograms suggestive of an infectious etiology. Reactive mediastinal  and hilar lymph nodes. Follow up chest imaging in 1-2 months is recommended to assure stability and/or resolution. Electronically Signed   By: Tollie Eth M.D.   On: 02/27/2016 12:13   US Renal  Result Date: 02/27/2016 CLINICAL DATA:  45 year old male with acute renal failure and increased BUN and creatinine. EXAM: RENAL / URINARY TRACT ULTRASOUND COMPLETE COMPARISON:  None. FINDINGS: Right Kidney: Length: 12.3 cm. Echogenicity within normal limits. No mass or hydronephrosis visualized. Left Kidney: Length: 12.5 cm. Echogenicity within normal limits. No mass or hydronephrosis visualized. Bladder: Appears normal for degree of bladder distention. There is diffuse increased hepatic echogenicity likely fatty infiltration. IMPRESSION: Unremarkable renal ultrasound. Fatty liver. Electronically Signed   By: Elgie Collard M.D.   On: 02/27/2016 01:51   Dg Chest Port 1 View  Result Date: 02/26/2016 CLINICAL DATA:  45 year old male with right-sided dialysis catheter  placement. EXAM: PORTABLE CHEST 1 VIEW COMPARISON:  Chest radiograph dated 02/26/2016 FINDINGS: Single portable view of the chest demonstrate clear lungs with sharp costophrenic angles. There is no pneumothorax. There is stable mild cardiomegaly. Right IJ dialysis catheter with tip over central SVC noted. No acute osseous pathology identified. IMPRESSION: Right IJ and dialysis catheter with tip over central SVC. No pneumothorax. No acute cardiopulmonary process. Mild cardiomegaly. Electronically Signed   By: Elgie Collard M.D.   On: 02/26/2016 21:27    Procedures Procedures (including critical care time)  Medications Ordered in ED Medications  multivitamin (RENA-VIT) tablet 1 tablet (1 tablet Oral Given 02/27/16 2201)  heparin injection 5,000 Units (5,000 Units Subcutaneous Given 02/27/16 2203)  sodium chloride flush (NS) 0.9 % injection 3 mL (3 mLs Intravenous Given 02/27/16 2204)  hydrALAZINE (APRESOLINE) injection 5 mg (5 mg Intravenous Not Given 02/27/16 0614)  calcium acetate (PHOSLO) capsule 1,334 mg (1,334 mg Oral Not Given 02/27/16 1630)  calcium gluconate 1 g in sodium chloride 0.9 % 100 mL IVPB (0 g Intravenous Stopped 02/26/16 1948)  insulin aspart (novoLOG) injection 10 Units (10 Units Intravenous Given 02/26/16 1916)  dextrose 50 % solution 50 mL (50 mLs Intravenous Given 02/26/16 1915)  sodium chloride 0.9 % bolus 1,000 mL (1,000 mLs Intravenous Transfusing/Transfer 02/26/16 1948)  sodium bicarbonate injection 50 mEq (50 mEq Intravenous Given 02/26/16 1920)  lidocaine (PF) (XYLOCAINE) 1 % injection (30 mLs  Given 02/26/16 2252)   CRITICAL CARE Performed by: Ranae Palms,  Total critical care time: 35 minutes Critical care time was exclusive of separately billable procedures and treating other patients. Critical care was necessary to treat or prevent imminent or life-threatening deterioration. Critical care was time spent personally by me on the following activities: development of  treatment plan with patient and/or surrogate as well as nursing, discussions with consultants, evaluation of patient's response to treatment, examination of patient, obtaining history from patient or surrogate, ordering and performing treatments and interventions, ordering and review of laboratory studies, ordering and review of radiographic studies, pulse oximetry and re-evaluation of patient's condition.   Initial Impression / Assessment and Plan / ED Course  I have reviewed the triage vital signs and the nursing notes.  Pertinent labs & imaging results that were available during my care of the patient were reviewed by me and considered in my medical decision making (see chart for details).  Clinical Course    Patient with uremic acidosis. Potassium is mildly elevated. Do not hold old EKG for comparison but there appears to be peaked T waves and widened QRS. Given calcium, insulin and glucose. Bedside bladder scan reveals  20 mL of urine in the bladder. Discussed with Dr. Briant Cedar. He will see the patient in the emergency department. Advised to give the patient 180 mg Lasix and an amp of sodium bicarbonate. Discussed with family medicine service. Will admit.  Dr. Briant Cedar will take emergently to dialysis. Final Clinical Impressions(s) / ED Diagnoses   Final diagnoses:  Acute renal failure, unspecified acute renal failure type (HCC)  Uremia  Metabolic acidosis  Opacity of lung on imaging study    New Prescriptions Current Discharge Medication List       Loren Racer, MD 02/27/16 [phone]

## 2016-02-26 NOTE — ED Notes (Signed)
Bladder scan: 15 ml

## 2016-02-26 NOTE — Progress Notes (Signed)
Critical lab value received: Ca - 6.4  Nephrology notified.  Will continue to monitor.

## 2016-02-26 NOTE — Procedures (Signed)
Rt IJ HD cath.  Rt neck prepped and draped in sterile fashion.  5cc 1% lidocaine used for anesthesia.  15cm 3 lumen HD cath placed via seldinger technique.  No complications. All lumens flushed with saline.  CXR ordered.

## 2016-02-26 NOTE — ED Triage Notes (Signed)
Pt here with sister, she reports that he has had increased weakness, fatigue, shortness of breath and confusion X1 week. Pt has had decreased PO intake. [facility]. Pt falling asleep and groaining in triage.

## 2016-02-26 NOTE — ED Triage Notes (Signed)
Pt here today with SOB and fatigue.  Pt reports being diagnosed with a lower respiratory infection 6 days ago and has been taking his Prednisone as prescribed.  His sister is with him today and states the pt may have some confusion.  Pt was able to answer all my questions correctly.  Pt is shaky on assessment with shallow, fast breathing.  Pt states this breathing started about a week ago. He denies any fever, dizziness, CP, or headaches.

## 2016-02-27 ENCOUNTER — Inpatient Hospital Stay (HOSPITAL_COMMUNITY): Payer: BLUE CROSS/BLUE SHIELD

## 2016-02-27 DIAGNOSIS — Z95828 Presence of other vascular implants and grafts: Secondary | ICD-10-CM

## 2016-02-27 DIAGNOSIS — N179 Acute kidney failure, unspecified: Secondary | ICD-10-CM | POA: Insufficient documentation

## 2016-02-27 DIAGNOSIS — E872 Acidosis, unspecified: Secondary | ICD-10-CM | POA: Insufficient documentation

## 2016-02-27 DIAGNOSIS — N19 Unspecified kidney failure: Secondary | ICD-10-CM

## 2016-02-27 DIAGNOSIS — R918 Other nonspecific abnormal finding of lung field: Secondary | ICD-10-CM

## 2016-02-27 DIAGNOSIS — R0600 Dyspnea, unspecified: Secondary | ICD-10-CM | POA: Insufficient documentation

## 2016-02-27 LAB — URINALYSIS, ROUTINE W REFLEX MICROSCOPIC
BILIRUBIN URINE: NEGATIVE
GLUCOSE, UA: NEGATIVE mg/dL
KETONES UR: NEGATIVE mg/dL
Leukocytes, UA: NEGATIVE
Nitrite: NEGATIVE
PROTEIN: 30 mg/dL — AB
Specific Gravity, Urine: 1.015 (ref 1.005–1.030)
pH: 5 (ref 5.0–8.0)

## 2016-02-27 LAB — URINE MICROSCOPIC-ADD ON

## 2016-02-27 LAB — RENAL FUNCTION PANEL
ALBUMIN: 2.6 g/dL — AB (ref 3.5–5.0)
ALBUMIN: 2.8 g/dL — AB (ref 3.5–5.0)
ANION GAP: 29 — AB (ref 5–15)
Anion gap: 23 — ABNORMAL HIGH (ref 5–15)
BUN: 206 mg/dL — ABNORMAL HIGH (ref 6–20)
BUN: 161 mg/dL — AB (ref 6–20)
CO2: 11 mmol/L — ABNORMAL LOW (ref 22–32)
CO2: 15 mmol/L — ABNORMAL LOW (ref 22–32)
CREATININE: 11.57 mg/dL — AB (ref 0.61–1.24)
Calcium: 7.7 mg/dL — ABNORMAL LOW (ref 8.9–10.3)
Calcium: 7.8 mg/dL — ABNORMAL LOW (ref 8.9–10.3)
Chloride: 94 mmol/L — ABNORMAL LOW (ref 101–111)
Chloride: 96 mmol/L — ABNORMAL LOW (ref 101–111)
Creatinine, Ser: 15.72 mg/dL — ABNORMAL HIGH (ref 0.61–1.24)
GFR, EST AFRICAN AMERICAN: 4 mL/min — AB (ref >60)
GFR, EST AFRICAN AMERICAN: 5 mL/min — AB (ref >60)
GFR, EST NON AFRICAN AMERICAN: 3 mL/min — AB (ref >60)
GFR, EST NON AFRICAN AMERICAN: 5 mL/min — AB (ref >60)
Glucose, Bld: 116 mg/dL — ABNORMAL HIGH (ref 65–99)
Glucose, Bld: 166 mg/dL — ABNORMAL HIGH (ref 65–99)
PHOSPHORUS: 11.6 mg/dL — AB (ref 2.5–4.6)
PHOSPHORUS: 8.5 mg/dL — AB (ref 2.5–4.6)
POTASSIUM: 3.3 mmol/L — AB (ref 3.5–5.1)
POTASSIUM: 3.3 mmol/L — AB (ref 3.5–5.1)
Sodium: 134 mmol/L — ABNORMAL LOW (ref 135–145)
Sodium: 134 mmol/L — ABNORMAL LOW (ref 135–145)

## 2016-02-27 LAB — CBC
HEMATOCRIT: 37.9 % — AB (ref 39.0–52.0)
Hemoglobin: 14 g/dL (ref 13.0–17.0)
MCH: 25.8 pg — ABNORMAL LOW (ref 26.0–34.0)
MCHC: 36.9 g/dL — ABNORMAL HIGH (ref 30.0–36.0)
MCV: 69.8 fL — ABNORMAL LOW (ref 78.0–100.0)
PLATELETS: 406 10*3/uL — AB (ref 150–400)
RBC: 5.43 MIL/uL (ref 4.22–5.81)
RDW: 16.8 % — AB (ref 11.5–15.5)
WBC: 28.7 10*3/uL — AB (ref 4.0–10.5)

## 2016-02-27 LAB — CBC WITH DIFFERENTIAL/PLATELET
BAND NEUTROPHILS: 4 %
BLASTS: 0 %
Basophils Absolute: 0 10*3/uL (ref 0.0–0.1)
Basophils Relative: 0 %
EOS ABS: 0 10*3/uL (ref 0.0–0.7)
Eosinophils Relative: 0 %
HCT: 37.2 % — ABNORMAL LOW (ref 39.0–52.0)
Hemoglobin: 13.5 g/dL (ref 13.0–17.0)
LYMPHS PCT: 3 %
Lymphs Abs: 0.8 10*3/uL (ref 0.7–4.0)
MCH: 25.3 pg — AB (ref 26.0–34.0)
MCHC: 36.3 g/dL — ABNORMAL HIGH (ref 30.0–36.0)
MCV: 69.7 fL — ABNORMAL LOW (ref 78.0–100.0)
Metamyelocytes Relative: 5 %
Monocytes Absolute: 2.5 10*3/uL — ABNORMAL HIGH (ref 0.1–1.0)
Monocytes Relative: 9 %
Myelocytes: 0 %
NEUTROS PCT: 79 %
NRBC: 0 /100{WBCs}
Neutro Abs: 24.2 10*3/uL — ABNORMAL HIGH (ref 1.7–7.7)
OTHER: 0 %
PLATELETS: 401 10*3/uL — AB (ref 150–400)
PROMYELOCYTES ABS: 0 %
RBC: 5.34 MIL/uL (ref 4.22–5.81)
RDW: 16.8 % — AB (ref 11.5–15.5)
WBC: 27.5 10*3/uL — AB (ref 4.0–10.5)

## 2016-02-27 LAB — PROTEIN / CREATININE RATIO, URINE
CREATININE, URINE: 208.75 mg/dL
Protein Creatinine Ratio: 0.26 mg/mg{Cre} — ABNORMAL HIGH (ref 0.00–0.15)
Total Protein, Urine: 55 mg/dL

## 2016-02-27 LAB — RAPID URINE DRUG SCREEN, HOSP PERFORMED
AMPHETAMINES: NOT DETECTED
BARBITURATES: NOT DETECTED
Benzodiazepines: NOT DETECTED
COCAINE: NOT DETECTED
Opiates: NOT DETECTED
TETRAHYDROCANNABINOL: NOT DETECTED

## 2016-02-27 LAB — MRSA PCR SCREENING: MRSA BY PCR: NEGATIVE

## 2016-02-27 LAB — PATHOLOGIST SMEAR REVIEW

## 2016-02-27 LAB — HIV ANTIBODY (ROUTINE TESTING W REFLEX): HIV Screen 4th Generation wRfx: NONREACTIVE

## 2016-02-27 MED ORDER — LIDOCAINE HCL (PF) 1 % IJ SOLN: 5.0000 mL | INTRAMUSCULAR | Status: DC | PRN

## 2016-02-27 MED ORDER — LIDOCAINE-PRILOCAINE 2.5-2.5 % EX CREA: 1.0000 "application " | TOPICAL_CREAM | CUTANEOUS | Status: DC | PRN

## 2016-02-27 MED ORDER — ALTEPLASE 2 MG IJ SOLR: 2.0000 mg | Freq: Once | INTRAMUSCULAR | Status: DC | PRN

## 2016-02-27 MED ORDER — SODIUM CHLORIDE 0.9 % IV SOLN: 100.0000 mL | INTRAVENOUS | Status: DC | PRN

## 2016-02-27 MED ORDER — HEPARIN SODIUM (PORCINE) 1000 UNIT/ML DIALYSIS: 1000.0000 [IU] | INTRAMUSCULAR | Status: DC | PRN

## 2016-02-27 MED ORDER — HEPARIN SODIUM (PORCINE) 5000 UNIT/ML IJ SOLN
5000.0000 [IU] | Freq: Three times a day (TID) | INTRAMUSCULAR | Status: DC
  Administered 2016-02-27 – 2016-03-04 (×18): 5000 [IU] via SUBCUTANEOUS
  Filled 2016-02-27 (×18): qty 1

## 2016-02-27 MED ORDER — SODIUM CHLORIDE 0.9% FLUSH
3.0000 mL | Freq: Two times a day (BID) | INTRAVENOUS | Status: DC
  Administered 2016-02-27 – 2016-03-03 (×11): 3 mL via INTRAVENOUS

## 2016-02-27 MED ORDER — HYDRALAZINE HCL 20 MG/ML IJ SOLN
5.0000 mg | INTRAMUSCULAR | Status: DC | PRN
  Administered 2016-02-28 – 2016-03-03 (×4): 5 mg via INTRAVENOUS
  Filled 2016-02-27 (×5): qty 1

## 2016-02-27 MED ORDER — HEPARIN SODIUM (PORCINE) 1000 UNIT/ML DIALYSIS
40.0000 [IU]/kg | Freq: Once | INTRAMUSCULAR | Status: DC
  Filled 2016-02-27: qty 5

## 2016-02-27 MED ORDER — CALCIUM ACETATE (PHOS BINDER) 667 MG PO CAPS
1334.0000 mg | ORAL_CAPSULE | Freq: Three times a day (TID) | ORAL | Status: DC
  Administered 2016-02-27 – 2016-03-04 (×16): 1334 mg via ORAL
  Filled 2016-02-27 (×21): qty 2

## 2016-02-27 MED ORDER — PENTAFLUOROPROP-TETRAFLUOROETH EX AERO: 1.0000 "application " | INHALATION_SPRAY | CUTANEOUS | Status: DC | PRN

## 2016-02-27 NOTE — Progress Notes (Signed)
Patient returned from CT

## 2016-02-27 NOTE — Progress Notes (Signed)
Subjective: Interval History: has no complaint, knows he is foggy.  Objective: Vital signs in last 24 hours: Temp:  [97.5 F (36.4 C)-98.1 F (36.7 C)] 97.8 F (36.6 C) (09/26 0700) Pulse Rate:  [72-97] 95 (09/26 0700) Resp:  [19-32] 29 (09/26 0700) BP: (111-177)/(39-86) 173/73 (09/26 0700) SpO2:  [98 %-100 %] 100 % (09/26 0700) Weight:  [104 kg (229 lb 4.5 oz)-105.2 kg (232 lb)] 104 kg (229 lb 4.5 oz) (09/26 0012) Weight change:   Intake/Output from previous day: 09/25 0701 - 09/26 0700 In: 240 [P.O.:240] Out: 1250 [Urine:250] Intake/Output this shift: Total I/O In: -  Out: 350 [Urine:350]  General appearance: no distress, morbidly obese and slowed mentation Neck: RIJ cath Cardio: regular rate and rhythm, S1, S2 normal and systolic murmur: holosystolic 2/6, blowing at apex GI: obese, pos bs, liver down 4 cm Extremities: edema 1+  Lab Results:  Recent Labs  02/26/16 1446 02/27/16 0758  WBC 9.0 27.5*  HGB 11.1* 13.5  HCT 34.6* 37.2*  PLT 101* 401*   BMET:  Recent Labs  02/26/16 2129 02/27/16 0439  NA 130* 134*  K 4.6 3.3*  CL 88* 94*  CO2 <7* 11*  GLUCOSE 104* 116*  BUN 272* 206*  CREATININE 21.80* 15.72*  CALCIUM 6.3* 7.7*   No results for input(s): PTH in the last 72 hours. Iron Studies: No results for input(s): IRON, TIBC, TRANSFERRIN, FERRITIN in the last 72 hours.  Studies/Results: Dg Chest 2 View  Result Date: 02/26/2016 CLINICAL DATA:  Weakness, fatigue, fever and shortness of breath for 1 week. EXAM: CHEST  2 VIEW COMPARISON:  None. FINDINGS: Cardiomediastinal silhouette is normal. Mediastinal contours appear intact. There is no evidence of pleural effusion or pneumothorax. Scattered peribronchial nodular and ground-glass opacities are seen throughout both lungs, more prominent on the right. Osseous structures are without acute abnormality. Soft tissues are grossly normal. IMPRESSION: Scattered peribronchial nodular and ground-glass opacities, more  prominent in the right lung. Differential diagnosis includes small airway disease, acute pneumonitis or less likely development of interstitial pulmonary edema. Electronically Signed   By: Ted Mcalpineobrinka  Dimitrova M.D.   On: 02/26/2016 13:54   [place] Renal  Result Date: 02/27/2016 CLINICAL DATA:  45 year old male with acute renal failure and increased BUN and creatinine. EXAM: RENAL / URINARY TRACT ULTRASOUND COMPLETE COMPARISON:  None. FINDINGS: Right Kidney: Length: 12.3 cm. Echogenicity within normal limits. No mass or hydronephrosis visualized. Left Kidney: Length: 12.5 cm. Echogenicity within normal limits. No mass or hydronephrosis visualized. Bladder: Appears normal for degree of bladder distention. There is diffuse increased hepatic echogenicity likely fatty infiltration. IMPRESSION: Unremarkable renal ultrasound. Fatty liver. Electronically Signed   By: Elgie CollardArash  Radparvar M.D.   On: 02/27/2016 01:51   Dg Chest Port 1 View  Result Date: 02/26/2016 CLINICAL DATA:  45 year old male with right-sided dialysis catheter placement. EXAM: PORTABLE CHEST 1 VIEW COMPARISON:  Chest radiograph dated 02/26/2016 FINDINGS: Single portable view of the chest demonstrate clear lungs with sharp costophrenic angles. There is no pneumothorax. There is stable mild cardiomegaly. Right IJ dialysis catheter with tip over central SVC noted. No acute osseous pathology identified. IMPRESSION: Right IJ and dialysis catheter with tip over central SVC. No pneumothorax. No acute cardiopulmonary process. Mild cardiomegaly. Electronically Signed   By: Elgie CollardArash  Radparvar M.D.   On: 02/26/2016 21:27    I have reviewed the patient's current medications. Prior to Admission:  Prescriptions Prior to Admission  Medication Sig Dispense Refill Last Dose  . hydrochlorothiazide (HYDRODIURIL) 25 MG tablet Take  1 tablet (25 mg total) by mouth daily. 30 tablet 0 02/26/2016 at Unknown time  . predniSONE (DELTASONE) 50 MG tablet Take daily with breakfast  7 tablet 0     Assessment/Plan: 1 AKI presumed.  Is making urine. Grossly uremic.  Improved chem, will plan HD today. Serologies pending.  CK ^ but not at usual Rhabdo levels but could be tail end of that.  Urine pending.  Strong FH renal dz. U/S normal size. 2 HTN slowly lower vol 3 ^ WBC  ? Steroids used for resp infx P HD, await blood tests, caution with slow HD,  Lower vol.    LOS: 1 day   , L 02/27/2016,9:22 AM

## 2016-02-27 NOTE — Progress Notes (Signed)
Patient returned from dialysis. Very tired, vital signs are stable, no c/o pain, no nausea/vomiting. Mother is at bedside. Rn and family have talked at length about dialysis as well as the need to become educated concerning kidney failure. Mother states they have a very strong family history of individuals who needed dialysis. Patient currently asleep, will wake when supper is served. Call bell in reach, will continue to monitor.

## 2016-02-27 NOTE — Progress Notes (Signed)
Patient off floor for CT.

## 2016-02-27 NOTE — Progress Notes (Signed)
Family Medicine Teaching Service Daily Progress Note Intern Pager: [phone]  Patient name: Harold Rodriguez Medical record number: [id number] Date of birth: 1971/01/12 Age: 45 y.o. Gender: male  Primary Care Provider: [facility] Consultants: nephrology Code Status: DNR  Pt Overview and Major Events to Date:  02/26/16 - presented to ED with tachypnea and compensated metabolic acidosis, Cr 22; nephro consulted, IJ cath placed, HD done  Assessment and Plan: Harold Rodriguez is a 45 y.o. male presenting with dysnpea, metabolic acidosis, and acute renal failure. PMH is significant for HTN, gout.   Acute Renal Failure: Cr 21.04 on admission. Phosphorus 19.8. Care everywhere Cr 09/2015 1.25. Past medical history only significant for HTN and gout. Pt reports SBPs in 180s when he checks, but this is quite a rapid change in kidney function from only HTN. Bladder scan for only 20cc of urine in ED (although patient reports making urine at home), making obstruction at level of bladder unlikely. Patient also denies seeing any blood in urine. Ck 5,000, unclear if this is secondary to intrinsic kidney injury versus other cause.  -nephrology following, appreciate recs -laboratory workup per nephro includes: C3 and C4 complement, anca, ANA, glomerular basement membrane antibodies, HIV, Hepatitis B and C antibodies, protein electrophoresis, peripheral smear, haptoglobin, LDH.  -trend CMPs -Obtain UA and Urine protein: Cr, urine collected and sent 9/26 am  Shortness of breath:  No history of lung disease. Presented to PCP on 9/19 with this concern, was given prednisone. Given metabolic acidosis in setting of acute kidney failure, patient is likely tachypneic due to excessive acid (BUN 272 on CMP; ABG pH 7.114, pCO2 20.4, bicarb 6.5). Saturating 100% on room air. CXR with scattered peribronchial nodular and ground-glass opacities, more prominent in the right lung. Tachynpea likely  caused by acidosis, other possible etiologies include sarcoidosis, pneuomonitis, or pulmonary edema (although not volume overloaded on exam). WBC elevated to 27 from 9.0, patient afebrile. Recent instrumentation for IJ cath, but site looks well.  -blood cultures ordered, will hold off on empiric antibiotics for now -continue to monitor after HD; continue aggressive treatment of uremia -Ct chest without contrast ordered, consider consulting pulmonology   HTN: Normotensive to slightly hypertensive on admission. Home meds include norvasc and losartan/HCTZ.  -PRN hydralazine SBP>160 / DBP >110  Gout: Patient unable to give history of most recent flair. No signs of acute flair at present. Patient reports compliance with this medication. -consider restarting home allopurinol   FEN/GI: renal diet Prophylaxis: heparin  Disposition: continued management of uremia in stepdown  Subjective:  Pt resting comfortably, tolerated HD and IJ cath insertion well. Just urinated and this was sent to lab. Patient understands the severity of his condition, is AOx4 and confirms his DNR status with emphasis this morning.  Objective: Temp:  [97.5 F (36.4 C)-98.1 F (36.7 C)] 97.8 F (36.6 C) (09/26 0700) Pulse Rate:  [72-97] 95 (09/26 0700) Resp:  [19-32] 29 (09/26 0700) BP: (111-177)/(39-86) 173/73 (09/26 0700) SpO2:  [98 %-100 %] 100 % (09/26 0700) Weight:  [229 lb 4.5 oz (104 kg)-232 lb (105.2 kg)] 229 lb 4.5 oz (104 kg) (09/26 0012) Physical Exam: General: Overweight male lying in bed, tachypneic. Neck: supple, no JVD Cardiovascular: rrr, no m/r/g, no edema Respiratory: CTAB, good air movement, tachypneic without increased work of breathing Gastrointestinal: SNTND, +BS MSK: strength 5/5 in all extremities, no pitting edema of LE Derm: no rashes on exposed skin, no wounds Neuro: CN II-XII grossly intact, sensation intact; AOx4  Psych: mood and affect appropriate  Laboratory:  Recent Labs Lab  02/26/16 1446 02/27/16 0758  WBC 9.0 27.5*  HGB 11.1* 13.5  HCT 34.6* 37.2*  PLT 101* 401*    Recent Labs Lab 02/26/16 1745 02/26/16 1847 02/26/16 2129 02/27/16 0439  NA  --  129* 130* 134*  K  --  5.1 4.6 3.3*  CL  --  85* 88* 94*  CO2  --  <7* <7* 11*  BUN  --  271* 272* 206*  CREATININE  --  22.07* 21.80* 15.72*  CALCIUM  --  6.4* 6.3* 7.7*  PROT 7.8  --  6.9  --   BILITOT 0.6  --  0.6  --   ALKPHOS 72  --  64  --   ALT 111*  --  103*  --   AST 40  --  37  --   GLUCOSE  --  158* 104* 116*   CK 5,400 Urine protein/Cr ratio 0.26 UA - squam 0-5; few bacteria, hyaline casts; cloudy, moderate hemoglobin, + protein  Imaging/Diagnostic Tests: Dg Chest 2 View  Result Date: 02/26/2016 CLINICAL DATA:  Weakness, fatigue, fever and shortness of breath for 1 week. EXAM: CHEST  2 VIEW COMPARISON:  None. FINDINGS: Cardiomediastinal silhouette is normal. Mediastinal contours appear intact. There is no evidence of pleural effusion or pneumothorax. Scattered peribronchial nodular and ground-glass opacities are seen throughout both lungs, more prominent on the right. Osseous structures are without acute abnormality. Soft tissues are grossly normal. IMPRESSION: Scattered peribronchial nodular and ground-glass opacities, more prominent in the right lung. Differential diagnosis includes small airway disease, acute pneumonitis or less likely development of interstitial pulmonary edema. Electronically Signed   By: Ted Mcalpineobrinka  Dimitrova M.D.   On: 02/26/2016 13:54   [place] Renal  Result Date: 02/27/2016 CLINICAL DATA:  45 year old male with acute renal failure and increased BUN and creatinine. EXAM: RENAL / URINARY TRACT ULTRASOUND COMPLETE COMPARISON:  None. FINDINGS: Right Kidney: Length: 12.3 cm. Echogenicity within normal limits. No mass or hydronephrosis visualized. Left Kidney: Length: 12.5 cm. Echogenicity within normal limits. No mass or hydronephrosis visualized. Bladder: Appears normal for  degree of bladder distention. There is diffuse increased hepatic echogenicity likely fatty infiltration. IMPRESSION: Unremarkable renal ultrasound. Fatty liver. Electronically Signed   By: Elgie CollardArash  Radparvar M.D.   On: 02/27/2016 01:51   Dg Chest Port 1 View  Result Date: 02/26/2016 CLINICAL DATA:  45 year old male with right-sided dialysis catheter placement. EXAM: PORTABLE CHEST 1 VIEW COMPARISON:  Chest radiograph dated 02/26/2016 FINDINGS: Single portable view of the chest demonstrate clear lungs with sharp costophrenic angles. There is no pneumothorax. There is stable mild cardiomegaly. Right IJ dialysis catheter with tip over central SVC noted. No acute osseous pathology identified. IMPRESSION: Right IJ and dialysis catheter with tip over central SVC. No pneumothorax. No acute cardiopulmonary process. Mild cardiomegaly. Electronically Signed   By: Elgie CollardArash  Radparvar M.D.   On: 02/26/2016 21:27    Garth BignessKathryn , MD 02/27/2016, 9:29 AM PGY-1, [facility] FPTS Intern pager: [phone], text pages welcome

## 2016-02-27 NOTE — Care Management Note (Signed)
Case Management Note  Patient Details  Name: Harold Rodriguez MRN: [id number] Date of Birth: 1970-06-29  Subjective/Objective:   Patient lives with father per sister, presents with AKI, needing emergent HD, NCM will cont to follow for dc needs.                  Action/Plan:   Expected Discharge Date:  03/02/16               Expected Discharge Plan:  Home/Self Care  In-House Referral:     Discharge planning Services  CM Consult  Post Acute Care Choice:    Choice offered to:     DME Arranged:    DME Agency:     HH Arranged:    HH Agency:     Status of Service:  In process, will continue to follow  If discussed at Long Length of Stay Meetings, dates discussed:    Additional Comments:  Leone Havenaylor,  [place], RN 02/27/2016, 6:50 PM

## 2016-02-27 NOTE — Progress Notes (Signed)
Report given to dialysis. Will call for pt around 1300-1330.

## 2016-02-27 NOTE — Progress Notes (Signed)
Patient taken to dialysis. 

## 2016-02-27 NOTE — Procedures (Signed)
I was present at this session.  I have reviewed the session itself and made appropriate changes.  HD via temp cath, low flows,short tx . bp 100-130s.    , L 9/26/20172:46 PM

## 2016-02-28 DIAGNOSIS — N179 Acute kidney failure, unspecified: Secondary | ICD-10-CM

## 2016-02-28 DIAGNOSIS — R0609 Other forms of dyspnea: Secondary | ICD-10-CM

## 2016-02-28 DIAGNOSIS — R0602 Shortness of breath: Secondary | ICD-10-CM

## 2016-02-28 DIAGNOSIS — R918 Other nonspecific abnormal finding of lung field: Secondary | ICD-10-CM

## 2016-02-28 LAB — HEPATITIS B SURFACE ANTIBODY,QUALITATIVE: Hep B S Ab: NONREACTIVE

## 2016-02-28 LAB — COMPREHENSIVE METABOLIC PANEL
ALBUMIN: 2.7 g/dL — AB (ref 3.5–5.0)
ALK PHOS: 71 U/L (ref 38–126)
ALT: 81 U/L — ABNORMAL HIGH (ref 17–63)
ANION GAP: 23 — AB (ref 5–15)
AST: 30 U/L (ref 15–41)
BILIRUBIN TOTAL: 0.8 mg/dL (ref 0.3–1.2)
BUN: 168 mg/dL — ABNORMAL HIGH (ref 6–20)
CALCIUM: 8.2 mg/dL — AB (ref 8.9–10.3)
CO2: 17 mmol/L — AB (ref 22–32)
Chloride: 95 mmol/L — ABNORMAL LOW (ref 101–111)
Creatinine, Ser: 10.42 mg/dL — ABNORMAL HIGH (ref 0.61–1.24)
GFR calc non Af Amer: 5 mL/min — ABNORMAL LOW (ref >60)
GFR, EST AFRICAN AMERICAN: 6 mL/min — AB (ref >60)
Glucose, Bld: 173 mg/dL — ABNORMAL HIGH (ref 65–99)
Potassium: 3.7 mmol/L (ref 3.5–5.1)
SODIUM: 135 mmol/L (ref 135–145)
TOTAL PROTEIN: 7.4 g/dL (ref 6.5–8.1)

## 2016-02-28 LAB — HEMOGLOBIN A1C
Hgb A1c MFr Bld: 6.4 % — ABNORMAL HIGH (ref 4.8–5.6)
Mean Plasma Glucose: 137 mg/dL

## 2016-02-28 LAB — PROTEIN ELECTROPHORESIS, SERUM
A/G Ratio: 0.6 — ABNORMAL LOW (ref 0.7–1.7)
ALBUMIN ELP: 2.6 g/dL — AB (ref 2.9–4.4)
Alpha-1-Globulin: 0.4 g/dL (ref 0.0–0.4)
Alpha-2-Globulin: 1.7 g/dL — ABNORMAL HIGH (ref 0.4–1.0)
Beta Globulin: 0.9 g/dL (ref 0.7–1.3)
GAMMA GLOBULIN: 1 g/dL (ref 0.4–1.8)
Globulin, Total: 4 g/dL — ABNORMAL HIGH (ref 2.2–3.9)
TOTAL PROTEIN ELP: 6.6 g/dL (ref 6.0–8.5)

## 2016-02-28 LAB — HAPTOGLOBIN: HAPTOGLOBIN: 824 mg/dL — AB (ref 34–200)

## 2016-02-28 LAB — MPO/PR-3 (ANCA) ANTIBODIES
ANCA Proteinase 3: <3.5 U/mL (ref 0.0–3.5)
Myeloperoxidase Abs: <9 U/mL (ref 0.0–9.0)

## 2016-02-28 LAB — CBC
HEMATOCRIT: 38.1 % — AB (ref 39.0–52.0)
HEMOGLOBIN: 13.9 g/dL (ref 13.0–17.0)
MCH: 25.9 pg — ABNORMAL LOW (ref 26.0–34.0)
MCHC: 36.5 g/dL — AB (ref 30.0–36.0)
MCV: 71.1 fL — ABNORMAL LOW (ref 78.0–100.0)
Platelets: 378 10*3/uL (ref 150–400)
RBC: 5.36 MIL/uL (ref 4.22–5.81)
RDW: 16.5 % — ABNORMAL HIGH (ref 11.5–15.5)
WBC: 26.8 10*3/uL — AB (ref 4.0–10.5)

## 2016-02-28 LAB — C4 COMPLEMENT: COMPLEMENT C4, BODY FLUID: 47 mg/dL — AB (ref 14–44)

## 2016-02-28 LAB — SEDIMENTATION RATE: Sed Rate: 88 mm/hr — ABNORMAL HIGH (ref 0–16)

## 2016-02-28 LAB — HEPATITIS C ANTIBODY (REFLEX)

## 2016-02-28 LAB — ANTINUCLEAR ANTIBODIES, IFA: ANTINUCLEAR ANTIBODIES, IFA: NEGATIVE

## 2016-02-28 LAB — CK: CK TOTAL: 1635 U/L — AB (ref 49–397)

## 2016-02-28 LAB — HCV COMMENT:

## 2016-02-28 LAB — PHOSPHORUS: PHOSPHORUS: 9.2 mg/dL — AB (ref 2.5–4.6)

## 2016-02-28 LAB — GLOMERULAR BASEMENT MEMBRANE ANTIBODIES: GBM Ab: 3 units (ref 0–20)

## 2016-02-28 LAB — C3 COMPLEMENT: C3 Complement: 173 mg/dL — ABNORMAL HIGH (ref 82–167)

## 2016-02-28 LAB — HEPATITIS B CORE ANTIBODY, TOTAL: Hep B Core Total Ab: NEGATIVE

## 2016-02-28 LAB — C-REACTIVE PROTEIN: CRP: 10.3 mg/dL — ABNORMAL HIGH (ref <1.0)

## 2016-02-28 LAB — HEPATITIS B SURFACE ANTIGEN: Hepatitis B Surface Ag: NEGATIVE

## 2016-02-28 MED ORDER — HYDRALAZINE HCL 20 MG/ML IJ SOLN
5.0000 mg | Freq: Once | INTRAMUSCULAR | Status: AC
  Administered 2016-02-28: 5 mg via INTRAVENOUS
  Filled 2016-02-28: qty 1

## 2016-02-28 MED ORDER — TUBERCULIN PPD 5 UNIT/0.1ML ID SOLN: 5.0000 [IU] | Freq: Once | INTRADERMAL | Status: DC

## 2016-02-28 MED ORDER — SALINE SPRAY 0.65 % NA SOLN
1.0000 | NASAL | Status: DC | PRN
  Filled 2016-02-28: qty 44

## 2016-02-28 NOTE — Care Management Note (Signed)
Case Management Note  Patient Details  Name: Harold Rodriguez MRN: [id number] Date of Birth: 1970/06/05  Subjective/Objective:     Presents with AKI needing emergent HD Monday 9/25  and Tuesday 9/26 and HTN, gout and sob.   Patient lives with his father, patient is indep pta, he has medication coverage and he has a PCP, and he has transportation at [employer].                  Action/Plan:   Expected Discharge Date:  03/02/16               Expected Discharge Plan:  Home/Self Care  In-House Referral:     Discharge planning Services  CM Consult  Post Acute Care Choice:    Choice offered to:     DME Arranged:    DME Agency:     HH Arranged:    HH Agency:     Status of Service:  In process, will continue to follow  If discussed at Long Length of Stay Meetings, dates discussed:    Additional Comments:  Leone Havenaylor,  [place], RN 02/28/2016, 10:57 AM

## 2016-02-28 NOTE — Progress Notes (Signed)
Pt stating that he wants to be a full code except for intubation and tube feed. Pt and mother present in room. Notified MD on call regarding this matter.

## 2016-02-28 NOTE — Progress Notes (Signed)
Subjective: Interval History: has complaints hiccups.  Objective: Vital signs in last 24 hours: Temp:  [97.4 F (36.3 C)-98.5 F (36.9 C)] 97.6 F (36.4 C) (09/27 0700) Pulse Rate:  [86-100] 90 (09/27 0700) Resp:  [20-31] 21 (09/27 0700) BP: (105-170)/(52-87) 170/86 (09/27 0700) SpO2:  [96 %-100 %] 96 % (09/27 0700) Weight:  [106.1 kg (233 lb 14.5 oz)-107.6 kg (237 lb 3.4 oz)] 106.1 kg (233 lb 14.5 oz) (09/26 1620) Weight change: 2.366 kg (5 lb 3.4 oz)  Intake/Output from previous day: 09/26 0701 - 09/27 0700 In: 480 [P.O.:480] Out: 2150 [Urine:650] Intake/Output this shift: No intake/output data recorded.  General appearance: cooperative, no distress, moderately obese and slowed mentation Neck: RIJ cath Resp: clear to auscultation bilaterally Cardio: S1, S2 normal and systolic murmur: holosystolic 2/6, blowing at apex GI: obese, pos bs, soft Extremities: extremities normal, atraumatic, no cyanosis or edema  Lab Results:  Recent Labs  02/27/16 2004 02/28/16 0516  WBC 28.7* 26.8*  HGB 14.0 13.9  HCT 37.9* 38.1*  PLT 406* 378   BMET:  Recent Labs  02/27/16 2004 02/28/16 0516  NA 134* 135  K 3.3* 3.7  CL 96* 95*  CO2 15* 17*  GLUCOSE 166* 173*  BUN 161* 168*  CREATININE 11.57* 10.42*  CALCIUM 7.8* 8.2*   No results for input(s): PTH in the last 72 hours. Iron Studies: No results for input(s): IRON, TIBC, TRANSFERRIN, FERRITIN in the last 72 hours.  Studies/Results: Dg Chest 2 View  Result Date: 02/26/2016 CLINICAL DATA:  Weakness, fatigue, fever and shortness of breath for 1 week. EXAM: CHEST  2 VIEW COMPARISON:  None. FINDINGS: Cardiomediastinal silhouette is normal. Mediastinal contours appear intact. There is no evidence of pleural effusion or pneumothorax. Scattered peribronchial nodular and ground-glass opacities are seen throughout both lungs, more prominent on the right. Osseous structures are without acute abnormality. Soft tissues are grossly normal.  IMPRESSION: Scattered peribronchial nodular and ground-glass opacities, more prominent in the right lung. Differential diagnosis includes small airway disease, acute pneumonitis or less likely development of interstitial pulmonary edema. Electronically Signed   By: Ted Mcalpineobrinka  Dimitrova M.D.   On: 02/26/2016 13:54   Ct Chest Wo Contrast  Result Date: 02/27/2016 CLINICAL DATA:  45 year old male with pulmonary opacities seen on recent chest radiographs. Nonsmoker. Denies pain nor dyspnea. Uncontrollable hiccups intermittently. EXAM: CT CHEST WITHOUT CONTRAST TECHNIQUE: Multidetector CT imaging of the chest was performed following the standard protocol without IV contrast. COMPARISON:  Chest radiographs dating back through 02/26/2016 FINDINGS: Cardiovascular: Top normal size cardiac chambers. No pericardial effusion nor significant pericardial thickening. Right IJ catheter terminates in the distal SVC. Mediastinum/Nodes: Small 1 cm or less in short axis paratracheal and right hilar lymph nodes. No axillary or supraclavicular adenopathy. Lungs/Pleura: Right upper lobe and right middle lobe confluent and ground-glass airspace opacities some of which demonstrate air bronchograms and do not violate pleural boundaries. Findings are likely infectious in etiology. No pleural effusion or pneumothorax. Upper Abdomen: Negative Musculoskeletal: Bilateral gynecomastia. Respiratory motion artifacts account for appearance of a lower sternal fracture on the sagittal re-formatted images. No acute osseous abnormality or is noted. IMPRESSION: Confluent right upper and middle lobe pulmonary consolidations and ground-glass opacities with air bronchograms suggestive of an infectious etiology. Reactive mediastinal and hilar lymph nodes. Follow up chest imaging in 1-2 months is recommended to assure stability and/or resolution. Electronically Signed   By: Tollie Ethavid  Kwon M.D.   On: 02/27/2016 12:13   [place] Renal  Result Date:  02/27/2016 CLINICAL  DATA:  45 year old male with acute renal failure and increased BUN and creatinine. EXAM: RENAL / URINARY TRACT ULTRASOUND COMPLETE COMPARISON:  None. FINDINGS: Right Kidney: Length: 12.3 cm. Echogenicity within normal limits. No mass or hydronephrosis visualized. Left Kidney: Length: 12.5 cm. Echogenicity within normal limits. No mass or hydronephrosis visualized. Bladder: Appears normal for degree of bladder distention. There is diffuse increased hepatic echogenicity likely fatty infiltration. IMPRESSION: Unremarkable renal ultrasound. Fatty liver. Electronically Signed   By: Elgie Collard M.D.   On: 02/27/2016 01:51   Dg Chest Port 1 View  Result Date: 02/26/2016 CLINICAL DATA:  46 year old male with right-sided dialysis catheter placement. EXAM: PORTABLE CHEST 1 VIEW COMPARISON:  Chest radiograph dated 02/26/2016 FINDINGS: Single portable view of the chest demonstrate clear lungs with sharp costophrenic angles. There is no pneumothorax. There is stable mild cardiomegaly. Right IJ dialysis catheter with tip over central SVC noted. No acute osseous pathology identified. IMPRESSION: Right IJ and dialysis catheter with tip over central SVC. No pneumothorax. No acute cardiopulmonary process. Mild cardiomegaly. Electronically Signed   By: Elgie Collard M.D.   On: 02/26/2016 21:27    I have reviewed the patient's current medications.  Assessment/Plan: 1 AKI  Making some urine but not a lot.  Less uremic, still acidemic.  Will hold off Hd today and plan tomorrow as was very uremic.  Puzzling etio yet.  Normal size kidneys, bland urine. Poss was vol depletion in settting of Losartan.  If not a lot better will do bx on Fri. However this is most c/w ATN 2 HTn hold off Treating 3 ^ WBC, ?? Steroids 4 Obesity 5 ^ Bs P HD on Thurs, follow vol, chem, BX if not better    LOS: 2 days   , L 02/28/2016,8:19 AM

## 2016-02-28 NOTE — Progress Notes (Signed)
RN asked patient to verify what he wanted for code status, while mother was not present to verify that is what patient wanted and not what the mother wanted.  Patient stated he wanted to be a full code with the exception of intubation and tube feeds.

## 2016-02-28 NOTE — Progress Notes (Signed)
Family Medicine Teaching Service Daily Progress Note Intern Pager: [phone]  Patient name: Harold Rodriguez Medical record number: [id number] Date of birth: 07-01-1970 Age: 45 y.o. Gender: male  Primary Care Provider: [facility] Consultants: nephrology Code Status: DNR  Pt Overview and Major Events to Date:  02/26/16 - presented to ED with tachypnea and compensated metabolic acidosis, Cr 22; nephro consulted, IJ cath placed, HD done  Assessment and Plan: Harold Rodriguez is a 45 y.o. male presenting with dysnpea, metabolic acidosis, and acute renal failure. PMH is significant for HTN, gout.   Acute Renal Failure: Cr 21.04 on admission. Phosphorus 19.8. Care everywhere Cr 09/2015 1.25. Past medical history only significant for HTN and gout. Pt reports SBPs in 180s when he checks, but this is quite a rapid change in kidney function from only HTN. Bladder scan for only 20cc of urine in ED (although patient reports making urine at home), making obstruction at level of bladder unlikely. Patient also denies seeing any blood in urine. Ck 5,000 > 1635. UA without concern for infection, + hyaline casts, which are nonspecific, and + protein. HCV and HIV neg. Hep B surface antigen neg. C3 and C4 complement mildly elevated. LDH and haptoglobin elevated > all of these are acute phase reactants in the setting of either nephritis or possible infection as below.  -nephrology following, appreciate recs: thinking ATN in the settting of volume depletion on arb -pending laboratory workup per nephro includes: anca, ANA, glomerular basement membrane antibodies, HIV, Hepatitis B and C antibodies, protein electrophoresis -trend CMPs  Shortness of breath:  No history of lung disease. Presented to PCP on 9/19 with this concern, was given prednisone. Given metabolic acidosis in setting of acute kidney failure, patient is likely tachypneic due to excessive acid (BUN 272 on CMP; ABG pH  7.114, pCO2 20.4, bicarb 6.5). Saturating 100% on room air. CXR with scattered peribronchial nodular and ground-glass opacities, more prominent in the right lung. Tachynpea likely caused by acidosis, other possible etiologies include sarcoidosis, pneuomonitis, or pulmonary edema (although not volume overloaded on exam). WBC elevated to 27 from 9.0, patient afebrile, but continues to be elevated to 26.8. Recent instrumentation for IJ cath, but site looks well.  -blood cultures ordered, will hold off on empiric antibiotics for now -continue to monitor after HD; continue aggressive treatment of uremia -Ct chest concerning for infectious source in RUL and RML, will consider pulm consult vs infection  HTN: Normotensive to slightly hypertensive on admission. Home meds include norvasc and losartan/HCTZ.  -PRN hydralazine SBP>160 / DBP >110  Gout: Patient unable to give history of most recent flair. No signs of acute flair at present. Patient reports compliance with this medication. -consider restarting home allopurinol   FEN/GI: renal diet Prophylaxis: heparin  Disposition: continued management of uremia in stepdown  Subjective:  Pt resting comfortably, tolerated HD well yesterday. Flattened affect. Says he doesn't like being in the hospital, but amenable to our care. Still concerned with hiccups, but none present while I visited with him.   Objective: Temp:  [97.4 F (36.3 C)-98.5 F (36.9 C)] 97.6 F (36.4 C) (09/27 0700) Pulse Rate:  [86-100] 90 (09/27 0700) Resp:  [20-29] 21 (09/27 0700) BP: (105-170)/(52-87) 170/86 (09/27 0700) SpO2:  [96 %-100 %] 96 % (09/27 0700) Weight:  [233 lb 14.5 oz (106.1 kg)-237 lb 3.4 oz (107.6 kg)] 233 lb 14.5 oz (106.1 kg) (09/26 1620) Physical Exam: General: Overweight male lying in bed, tachypneic. Neck: supple, no JVD  Cardiovascular: rrr, no m/r/g, no edema Respiratory: CTAB, good air movement, tachypneic without increased work of  breathing Gastrointestinal: SNTND, +BS MSK: strength 5/5 in all extremities, no pitting edema of LE Derm: no rashes on exposed skin, no wounds Neuro: CN II-XII grossly intact, sensation intact; AOx4 Psych: mood and affect appropriate  Laboratory:  Recent Labs Lab 02/27/16 0758 02/27/16 2004 02/28/16 0516  WBC 27.5* 28.7* 26.8*  HGB 13.5 14.0 13.9  HCT 37.2* 37.9* 38.1*  PLT 401* 406* 378    Recent Labs Lab 02/26/16 1745  02/26/16 2129 02/27/16 0439 02/27/16 2004 02/28/16 0516  NA  --   < > 130* 134* 134* 135  K  --   < > 4.6 3.3* 3.3* 3.7  CL  --   < > 88* 94* 96* 95*  CO2  --   < > <7* 11* 15* 17*  BUN  --   < > 272* 206* 161* 168*  CREATININE  --   < > 21.80* 15.72* 11.57* 10.42*  CALCIUM  --   < > 6.3* 7.7* 7.8* 8.2*  PROT 7.8  --  6.9  --   --  7.4  BILITOT 0.6  --  0.6  --   --  0.8  ALKPHOS 72  --  64  --   --  71  ALT 111*  --  103*  --   --  81*  AST 40  --  37  --   --  30  GLUCOSE  --   < > 104* 116* 166* 173*  < > = values in this interval not displayed. CK 5,400 Urine protein/Cr ratio 0.26 UA - squam 0-5; few bacteria, hyaline casts; cloudy, moderate hemoglobin, + protein  Imaging/Diagnostic Tests: Ct Chest Wo Contrast  Result Date: 02/27/2016 CLINICAL DATA:  45 year old male with pulmonary opacities seen on recent chest radiographs. Nonsmoker. Denies pain nor dyspnea. Uncontrollable hiccups intermittently. EXAM: CT CHEST WITHOUT CONTRAST TECHNIQUE: Multidetector CT imaging of the chest was performed following the standard protocol without IV contrast. COMPARISON:  Chest radiographs dating back through 02/26/2016 FINDINGS: Cardiovascular: Top normal size cardiac chambers. No pericardial effusion nor significant pericardial thickening. Right IJ catheter terminates in the distal SVC. Mediastinum/Nodes: Small 1 cm or less in short axis paratracheal and right hilar lymph nodes. No axillary or supraclavicular adenopathy. Lungs/Pleura: Right upper lobe and right  middle lobe confluent and ground-glass airspace opacities some of which demonstrate air bronchograms and do not violate pleural boundaries. Findings are likely infectious in etiology. No pleural effusion or pneumothorax. Upper Abdomen: Negative Musculoskeletal: Bilateral gynecomastia. Respiratory motion artifacts account for appearance of a lower sternal fracture on the sagittal re-formatted images. No acute osseous abnormality or is noted. IMPRESSION: Confluent right upper and middle lobe pulmonary consolidations and ground-glass opacities with air bronchograms suggestive of an infectious etiology. Reactive mediastinal and hilar lymph nodes. Follow up chest imaging in 1-2 months is recommended to assure stability and/or resolution. Electronically Signed   By: Tollie Ethavid  Kwon M.D.   On: 02/27/2016 12:13    Garth BignessKathryn , MD 02/28/2016, 9:28 AM PGY-1, [facility] FPTS Intern pager: [phone], text pages welcome

## 2016-02-28 NOTE — Consult Note (Signed)
Name: Harold Rodriguez MRN: [id number] DOB: [birth year]    ADMISSION DATE:  02/26/2016 CONSULTATION DATE:  9/27    REFERRING MD :  FPTS  CHIEF COMPLAINT:  Abnormal CT   BRIEF PATIENT DESCRIPTION: 45yo male with hx HTN and gout initially admitted 9/25 with dyspnea, metabolic acidosis and acute renal failure with SCr 21, phos 19.8 on admission.  U/a without concern for infection, negative renal u/s.  WBC notable at 26.  He was started on dialysis and seen in consultation by renal who feel this is likely ANT in setting volume depletion on ARB.  Had ongoing dyspnea and CT chest revealed RUL and RML ground glass opacities and reactive hilar and mediastinal lymph nodes and PCCM was consulted to assist.   SIGNIFICANT EVENTS    STUDIES:  Renal u/s 9/26>>> Unremarkable renal ultrasound.  Fatty liver. CT chest 9/26>>> Confluent right upper and middle lobe pulmonary consolidations and ground-glass opacities with air bronchograms suggestive of an infectious etiology. Reactive mediastinal and hilar lymph nodes. UDS 9/26>> neg    Autoimmune:  HIV >>NR HCV >>neg ANA >> NEG GBM>>NEG ANCA  C3 >> 173 (H) C4 >>47 (H) Hep B surgace ag>> neg  Hep B >>neg  Hep C >>neg    HISTORY OF PRESENT ILLNESS:  45yo male with hx HTN and gout initially admitted 9/25 with dyspnea, metabolic acidosis and acute renal failure with SCr 21, phos 19.8 on admission.  U/a without concern for infection, negative renal u/s.  He was started on dialysis and seen in consultation by renal who feel this is likely ANT in setting volume depletion on ARB.  Had ongoing dyspnea and CT chest revealed RUL and RML ground glass opacities and reactive hilar and mediastinal lymph nodes and PCCM was consulted to assist.   Smoker - never smoker, no ETOH.  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 No other known environmental exposures.  No recent travel or sick contacts.  NO known TB exposures, never  incarcerated.   C/o SOB but improved. This has been ongoing.  Saw PCP for this prior to this admit and given prednisone. Mild elbow pain, has hx gout, otherwise no other c/o.    PAST MEDICAL HISTORY :   has a past medical history of Gout and Hypertension.  has no past surgical history on file. Prior to Admission medications   Medication Sig Start Date End Date Taking? Authorizing Provider  allopurinol (ZYLOPRIM) 100 MG tablet Take 100 mg by mouth 2 (two) times daily.   Yes Historical Provider, MD  amLODipine (NORVASC) 5 MG tablet Take 5 mg by mouth daily.   Yes Historical Provider, MD  ibuprofen (ADVIL,MOTRIN) 200 MG tablet Take 600 mg by mouth every 6 (six) hours as needed (pain).   Yes Historical Provider, MD  losartan-hydrochlorothiazide (HYZAAR) 100-25 MG tablet Take 1 tablet by mouth daily. 09/07/15  Yes Historical Provider, MD  predniSONE (DELTASONE) 10 MG tablet Take 10-30 mg by mouth See admin instructions. 30 mg once a day for 3 days then 20 mg once a day for 3 days then 10 mg once a day for 3 days 02/20/16  Yes Historical Provider, MD  hydrochlorothiazide (HYDRODIURIL) 25 MG tablet Take 1 tablet (25 mg total) by mouth daily. Patient not taking: Reported on 02/27/2016 12/23/13   Waldemar Dickens, MD  predniSONE (DELTASONE) 50 MG tablet Take daily with breakfast Patient not taking: Reported on 02/27/2016 12/23/13   Waldemar Dickens, MD   No  Known Allergies  FAMILY HISTORY:  family history is not on file. SOCIAL HISTORY:  reports that he has never smoked. He has never used smokeless tobacco. He reports that he drinks alcohol. He reports that he does not use drugs.  REVIEW OF SYSTEMS:   As per HPI - All other systems reviewed and were neg.   SUBJECTIVE:   VITAL SIGNS: Temp:  [97.6 F (36.4 C)-98.5 F (36.9 C)] 98 F (36.7 C) (09/27 1100) Pulse Rate:  [90-99] 93 (09/27 1100) Resp:  [21-28] 21 (09/27 1100) BP: (130-170)/(75-87) 165/84 (09/27 1100) SpO2:  [96 %-100 %] 96 % (09/27  1100) Weight:  [106.1 kg (233 lb 14.5 oz)] 106.1 kg (233 lb 14.5 oz) (09/26 1620)  PHYSICAL EXAMINATION: General:  Well developed male in mild respiratory distress. Neuro:  Alert and interactive, moving all ext to command. HEENT:  [place]/AT, PERRL, EOM-I and MMM. Cardiovascular:  RRR, Nl S1/S2, -M/R/G. Lungs:  CTA bilaterally. Abdomen:  Soft, NT, ND and +BS. Musculoskeletal:  -edema and -tenderness. Skin:  Intact.   Recent Labs Lab 02/27/16 0439 02/27/16 2004 02/28/16 0516  NA 134* 134* 135  K 3.3* 3.3* 3.7  CL 94* 96* 95*  CO2 11* 15* 17*  BUN 206* 161* 168*  CREATININE 15.72* 11.57* 10.42*  GLUCOSE 116* 166* 173*    Recent Labs Lab 02/27/16 0758 02/27/16 2004 02/28/16 0516  HGB 13.5 14.0 13.9  HCT 37.2* 37.9* 38.1*  WBC 27.5* 28.7* 26.8*  PLT 401* 406* 378   Ct Chest Wo Contrast  Result Date: 02/27/2016 CLINICAL DATA:  45 year old male with pulmonary opacities seen on recent chest radiographs. Nonsmoker. Denies pain nor dyspnea. Uncontrollable hiccups intermittently. EXAM: CT CHEST WITHOUT CONTRAST TECHNIQUE: Multidetector CT imaging of the chest was performed following the standard protocol without IV contrast. COMPARISON:  Chest radiographs dating back through 02/26/2016 FINDINGS: Cardiovascular: Top normal size cardiac chambers. No pericardial effusion nor significant pericardial thickening. Right IJ catheter terminates in the distal SVC. Mediastinum/Nodes: Small 1 cm or less in short axis paratracheal and right hilar lymph nodes. No axillary or supraclavicular adenopathy. Lungs/Pleura: Right upper lobe and right middle lobe confluent and ground-glass airspace opacities some of which demonstrate air bronchograms and do not violate pleural boundaries. Findings are likely infectious in etiology. No pleural effusion or pneumothorax. Upper Abdomen: Negative Musculoskeletal: Bilateral gynecomastia. Respiratory motion artifacts account for appearance of a lower sternal fracture on  the sagittal re-formatted images. No acute osseous abnormality or is noted. IMPRESSION: Confluent right upper and middle lobe pulmonary consolidations and ground-glass opacities with air bronchograms suggestive of an infectious etiology. Reactive mediastinal and hilar lymph nodes. Follow up chest imaging in 1-2 months is recommended to assure stability and/or resolution. Electronically Signed   By: Ashley Royalty M.D.   On: 02/27/2016 12:13   US Renal  Result Date: 02/27/2016 CLINICAL DATA:  45 year old male with acute renal failure and increased BUN and creatinine. EXAM: RENAL / URINARY TRACT ULTRASOUND COMPLETE COMPARISON:  None. FINDINGS: Right Kidney: Length: 12.3 cm. Echogenicity within normal limits. No mass or hydronephrosis visualized. Left Kidney: Length: 12.5 cm. Echogenicity within normal limits. No mass or hydronephrosis visualized. Bladder: Appears normal for degree of bladder distention. There is diffuse increased hepatic echogenicity likely fatty infiltration. IMPRESSION: Unremarkable renal ultrasound. Fatty liver. Electronically Signed   By: Anner Crete M.D.   On: 02/27/2016 01:51   Dg Chest Port 1 View  Result Date: 02/26/2016 CLINICAL DATA:  45 year old male with right-sided dialysis catheter placement. EXAM: PORTABLE CHEST  1 VIEW COMPARISON:  Chest radiograph dated 02/26/2016 FINDINGS: Single portable view of the chest demonstrate clear lungs with sharp costophrenic angles. There is no pneumothorax. There is stable mild cardiomegaly. Right IJ dialysis catheter with tip over central SVC noted. No acute osseous pathology identified. IMPRESSION: Right IJ and dialysis catheter with tip over central SVC. No pneumothorax. No acute cardiopulmonary process. Mild cardiomegaly. Electronically Signed   By: Anner Crete M.D.   On: 02/26/2016 21:27    ASSESSMENT / PLAN:  RUL/RML ground glass opacities - unclear etiology.  DDX includes wegener's granulomatosis, TB (much less likely),  ?aspiration. Eosinophils neg so not eosinophilic PNA.  DOE and SOB: at rest O2 sat of 94% on RA Acute renal failure - thought to be r/t volume depletion in setting ARB however, etiology remains puzzling.   PLAN -  HD per renal  Quantiferon gold  F/u CXR  Consider PFT's - hold off for now.  Check IgE, IGA, IGG Check ESR, CRP, ACE, anti-SCL 70 Per renal if no sig improvement on Friday consider renal bx  If no renal bx will consider FOB  Rush Farmer, M.D. [facility] Pulmonary/Critical Care Medicine. Pager: [phone]. After hours pager: [phone].

## 2016-02-28 NOTE — Progress Notes (Signed)
Family Medicine Teaching Service Daily Progress Note Intern Pager: [phone]  Patient name: Harold Rodriguez Medical record number: [id number] Date of birth: [birth year] Age: 45 y.o. Gender: male  Primary Care Provider: [facility] Consultants: nephrology Code Status: DNR  Pt Overview and Major Events to Date:  02/26/16 - presented to ED with tachypnea and compensated metabolic acidosis, Cr 22; nephro consulted, IJ cath placed, HD done 9/26 HD conducted, patient tolerated well  Assessment and Plan: Harold Rodriguez is a 45 y.o. male presenting with dysnpea, metabolic acidosis, and acute renal failure. PMH is significant for HTN, gout.   Acute Renal Failure: Cr 21.04 on admission. Phosphorus 19.8.Care everywhere Cr 09/2015 1.25. Past medical history only significant for HTN and gout. Pt reports SBPs in 180s when he checks, but this is quite a rapid change in kidney function from only HTN. Bladder scan for only 20cc of urine in ED (although patient reports making urine at home), making obstruction at level of bladder unlikely. Patient also denies seeing any blood in urine. Ck 5,000 > 1635. UA without concern for infection, + hyaline casts, which are nonspecific, and + protein. HCV and HIV neg. Hep B surface antigen neg. C3 and C4 complement mildly elevated. LDH and haptoglobin elevated > all of these are acute phase reactants in the setting of either nephritis or possible infection as below.  -nephrology following, appreciate recs: thinking ATN in the settting of volume depletion on arb -pending laboratory workup per nephro includes: anca, ANA, glomerular basement membrane antibodies, HIV, Hepatitis B and C antibodies, protein electrophoresis -trend CMPs  Ground glass opacities in RUL and RML, leukocytosis:  No history of lung disease. Presented to PCP on 9/19 with this concern, was given prednisone. Given metabolic acidosis in setting of acute kidney failure,  patient is likely tachypneic due to excessive acid (BUN 272 on CMP; ABG pH 7.114, pCO2 20.4, bicarb 6.5). Saturating 100% on room air. CXR with scattered peribronchial nodular and ground-glass opacities, more prominent in the right lung. Tachynpea likely caused by acidosis, other possible etiologies include sarcoidosis, pneuomonitis, or pulmonary edema (although not volume overloaded on exam). WBC elevated to 27 from 9.0, patient afebrile. Recent instrumentation for IJ cath, but site looks well. Patient denies any exposure to TB, incarceration, or work in healthcare. 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 No exposure to harsh chemicals or small particles in factories. -blood cultures ordered, will hold off on empiric antibiotics for now -consulting pulm, appreciate recs, specifically sarcoid vs possible TB screening  HTN: Normotensive to slightly hypertensive on admission. Home meds include norvasc and losartan/HCTZ.  -PRN hydralazine SBP>160 / DBP >110  Gout: Patient unable to give history of most recent flair. No signs of acute flair at present. Patient reports compliance with this medication. -consider restarting home allopurinol   FEN/GI: renal diet Prophylaxis: heparin  Disposition: transfer to floor  Subjective:  Pt resting comfortably, tolerated HD well yesterday. Flattened affect. Says he doesn't like being in the hospital, but amenable to our care. Still concerned with hiccups, but none present while I visited with him.   Objective: Temp:  [97.4 F (36.3 C)-98.5 F (36.9 C)] 98 F (36.7 C) (09/27 0400) Pulse Rate:  [86-100] 92 (09/27 0347) Resp:  [20-31] 26 (09/27 0347) BP: (105-169)/(52-87) 144/80 (09/27 0347) SpO2:  [97 %-100 %] 99 % (09/27 0347) Weight:  [233 lb 14.5 oz (106.1 kg)-237 lb 3.4 oz (107.6 kg)] 233 lb 14.5 oz (  106.1 kg) (09/26 1620) Physical Exam: General: Overweight male lying in bed, tachypneic. Neck: supple, no JVD Cardiovascular:  rrr, no m/r/g, no edema Respiratory: CTAB, good air movement, tachypneic without increased work of breathing Gastrointestinal: SNTND, +BS MSK: strength 5/5 in all extremities, no pitting edema of LE Derm: no rashes on exposed skin, no wounds Neuro: CN II-XII grossly intact, sensation intact; AOx4 Psych: mood and affect appropriate  Laboratory:  Recent Labs Lab 02/27/16 0758 02/27/16 2004 02/28/16 0516  WBC 27.5* 28.7* 26.8*  HGB 13.5 14.0 13.9  HCT 37.2* 37.9* 38.1*  PLT 401* 406* 378    Recent Labs Lab 02/26/16 1745  02/26/16 2129 02/27/16 0439 02/27/16 2004 02/28/16 0516  NA  --   < > 130* 134* 134* 135  K  --   < > 4.6 3.3* 3.3* 3.7  CL  --   < > 88* 94* 96* 95*  CO2  --   < > <7* 11* 15* 17*  BUN  --   < > 272* 206* 161* 168*  CREATININE  --   < > 21.80* 15.72* 11.57* 10.42*  CALCIUM  --   < > 6.3* 7.7* 7.8* 8.2*  PROT 7.8  --  6.9  --   --  7.4  BILITOT 0.6  --  0.6  --   --  0.8  ALKPHOS 72  --  64  --   --  71  ALT 111*  --  103*  --   --  81*  AST 40  --  37  --   --  30  GLUCOSE  --   < > 104* 116* 166* 173*  < > = values in this interval not displayed. CK 5,400 Urine protein/Cr ratio 0.26 UA - squam 0-5; few bacteria, hyaline casts; cloudy, moderate hemoglobin, + protein  Imaging/Diagnostic Tests: Ct Chest Wo Contrast  Result Date: 02/27/2016 CLINICAL DATA:  45 year old male with pulmonary opacities seen on recent chest radiographs. Nonsmoker. Denies pain nor dyspnea. Uncontrollable hiccups intermittently. EXAM: CT CHEST WITHOUT CONTRAST TECHNIQUE: Multidetector CT imaging of the chest was performed following the standard protocol without IV contrast. COMPARISON:  Chest radiographs dating back through 02/26/2016 FINDINGS: Cardiovascular: Top normal size cardiac chambers. No pericardial effusion nor significant pericardial thickening. Right IJ catheter terminates in the distal SVC. Mediastinum/Nodes: Small 1 cm or less in short axis paratracheal and right  hilar lymph nodes. No axillary or supraclavicular adenopathy. Lungs/Pleura: Right upper lobe and right middle lobe confluent and ground-glass airspace opacities some of which demonstrate air bronchograms and do not violate pleural boundaries. Findings are likely infectious in etiology. No pleural effusion or pneumothorax. Upper Abdomen: Negative Musculoskeletal: Bilateral gynecomastia. Respiratory motion artifacts account for appearance of a lower sternal fracture on the sagittal re-formatted images. No acute osseous abnormality or is noted. IMPRESSION: Confluent right upper and middle lobe pulmonary consolidations and ground-glass opacities with air bronchograms suggestive of an infectious etiology. Reactive mediastinal and hilar lymph nodes. Follow up chest imaging in 1-2 months is recommended to assure stability and/or resolution. Electronically Signed   By: Tollie Ethavid  Kwon M.D.   On: 02/27/2016 12:13    Garth BignessKathryn Timberlake, MD 02/28/2016, 7:10 AM PGY-1, [facility] FPTS Intern pager: [phone], text pages welcome

## 2016-02-29 ENCOUNTER — Inpatient Hospital Stay (HOSPITAL_COMMUNITY): Payer: BLUE CROSS/BLUE SHIELD

## 2016-02-29 DIAGNOSIS — R0600 Dyspnea, unspecified: Secondary | ICD-10-CM

## 2016-02-29 DIAGNOSIS — R9431 Abnormal electrocardiogram [ECG] [EKG]: Secondary | ICD-10-CM

## 2016-02-29 LAB — COMPREHENSIVE METABOLIC PANEL
ALK PHOS: 66 U/L (ref 38–126)
ALT: 65 U/L — AB (ref 17–63)
AST: 30 U/L (ref 15–41)
Albumin: 2.6 g/dL — ABNORMAL LOW (ref 3.5–5.0)
Anion gap: 18 — ABNORMAL HIGH (ref 5–15)
BILIRUBIN TOTAL: 1 mg/dL (ref 0.3–1.2)
BUN: 162 mg/dL — ABNORMAL HIGH (ref 6–20)
CALCIUM: 8.6 mg/dL — AB (ref 8.9–10.3)
CHLORIDE: 96 mmol/L — AB (ref 101–111)
CO2: 20 mmol/L — ABNORMAL LOW (ref 22–32)
Creatinine, Ser: 6.88 mg/dL — ABNORMAL HIGH (ref 0.61–1.24)
GFR calc Af Amer: 10 mL/min — ABNORMAL LOW (ref >60)
GFR, EST NON AFRICAN AMERICAN: 9 mL/min — AB (ref >60)
Glucose, Bld: 113 mg/dL — ABNORMAL HIGH (ref 65–99)
Potassium: 3.2 mmol/L — ABNORMAL LOW (ref 3.5–5.1)
Sodium: 134 mmol/L — ABNORMAL LOW (ref 135–145)
TOTAL PROTEIN: 7.2 g/dL (ref 6.5–8.1)

## 2016-02-29 LAB — ECHOCARDIOGRAM COMPLETE
EERAT: 5.7
EWDT: 267 ms
FS: 45 % — AB (ref 28–44)
Height: 74 in
IVS/LV PW RATIO, ED: 0.91
LA ID, A-P, ES: 36 mm
LA diam end sys: 36 mm
LA diam index: 1.55 cm/m2
LA vol A4C: 35.2 ml
LA vol index: 17.7 mL/m2
LAVOL: 41.1 mL
LDCA: 3.14 cm2
LV E/e' medial: 5.7
LV E/e'average: 5.7
LV PW d: 12.9 mm — AB (ref 0.6–1.1)
LV TDI E'LATERAL: 13.3
LV TDI E'MEDIAL: 8.05
LV e' LATERAL: 13.3 cm/s
LVOTD: 20 mm
MV Dec: 267
MV pk A vel: 80.5 m/s
MVPG: 2 mmHg
MVPKEVEL: 75.8 m/s
RV LATERAL S' VELOCITY: 18.5 cm/s
RV TAPSE: 23 mm
Weight: 3742.53 oz

## 2016-02-29 LAB — CBC
HEMATOCRIT: 39.3 % (ref 39.0–52.0)
Hemoglobin: 14 g/dL (ref 13.0–17.0)
MCH: 25.8 pg — ABNORMAL LOW (ref 26.0–34.0)
MCHC: 35.6 g/dL (ref 30.0–36.0)
MCV: 72.5 fL — AB (ref 78.0–100.0)
Platelets: 345 10*3/uL (ref 150–400)
RBC: 5.42 MIL/uL (ref 4.22–5.81)
RDW: 16.2 % — ABNORMAL HIGH (ref 11.5–15.5)
WBC: 20.8 10*3/uL — AB (ref 4.0–10.5)

## 2016-02-29 LAB — ANTI-SCLERODERMA ANTIBODY: Scleroderma (Scl-70) (ENA) Antibody, IgG: 0.6 AI (ref 0.0–0.9)

## 2016-02-29 LAB — ANGIOTENSIN CONVERTING ENZYME: Angiotensin-Converting Enzyme: 48 U/L (ref 14–82)

## 2016-02-29 MED ORDER — TUBERCULIN PPD 5 UNIT/0.1ML ID SOLN
5.0000 [IU] | Freq: Once | INTRADERMAL | Status: DC
  Filled 2016-02-29: qty 0.1

## 2016-02-29 MED ORDER — POTASSIUM CHLORIDE 20 MEQ PO PACK
20.0000 meq | PACK | Freq: Once | ORAL | Status: AC
  Administered 2016-02-29: 20 meq via ORAL
  Filled 2016-02-29: qty 1

## 2016-02-29 NOTE — Progress Notes (Signed)
  Echocardiogram 2D Echocardiogram has been performed.  Delcie RochENNINGTON,  02/29/2016, 3:56 PM

## 2016-02-29 NOTE — Progress Notes (Signed)
Family Medicine Teaching Service Daily Progress Note Intern Pager: [phone]  Patient name: Harold Rodriguez Medical record number: [id number] Date of birth: 1970-11-06 Age: 45 y.o. Gender: male  Primary Care Provider: [place] Consultants: nephrology, pulmonology Code Status: DNI, no tube feeds  Pt Overview and Major Events to Date:  02/26/16 - presented to ED with tachypnea and compensated metabolic acidosis, Cr 22; nephro consulted, IJ cath placed, HD done  Assessment and Plan: Harold Rodriguez is a 45 y.o. male presenting with dysnpea, metabolic acidosis, and acute renal failure. PMH is significant for HTN, gout.   Acute Renal Failure: Cr 21.04 on admission. Phosphorus 19.8. Care everywhere Cr 09/2015 1.25. Past medical history only significant for HTN and gout. Pt reports SBPs in 180s when he checks, but this is quite a rapid change in kidney function from only HTN. Bladder scan for only 20cc of urine in ED (although patient reports making urine at home), making obstruction at level of bladder unlikely. Patient also denies seeing any blood in urine. Ck 5,000 > 1635. UA without concern for infection, + hyaline casts, which are nonspecific, and + protein. HCV and HIV neg. Hep B surface antigen neg. C3 and C4 complement mildly elevated. LDH and haptoglobin elevated > all of these are acute phase reactants in the setting of either nephritis or possible infection as below.  -nephrology following, appreciate recs: thinking ATN in the settting of volume depletion on arb -Creatinine trending down to 6.88 today -pending laboratory workup per nephro includes: anca, ANA, glomerular basement membrane antibodies, HIV, Hep C antibodies, protein electrophoresis -Heb B negative -trend CMPs  Shortness of breath:  No history of lung disease. Presented to PCP on 9/19 with this concern, was given prednisone. Given metabolic acidosis in setting of acute kidney failure,  patient is likely tachypneic due to excessive acid (BUN 272 on CMP; ABG pH 7.114, pCO2 20.4, bicarb 6.5). Saturating 100% on room air. CXR with scattered peribronchial nodular and ground-glass opacities, more prominent in the right lung. Tachynpea likely caused by acidosis, other possible etiologies include sarcoidosis, pneuomonitis, or pulmonary edema (although not volume overloaded on exam). WBC elevated to 27 from 9.0, patient afebrile, but continues to be elevated to 26.8. Recent instrumentation for IJ cath, but site looks well.  -blood cultures ordered, will hold off on empiric antibiotics for now- no growth x 2 days -continue to monitor after HD; continue aggressive treatment of uremia -Ct chest concerning for infectious source in RUL and RML -pulm following, appreciate their recs -will need PFT's possibly as outpatient -quantiferon gold pending -consider bronchoscopy  -IgE, IgA, IgG, ACE, Anti-scl70  -ESR 88, CRP 10.3  HTN: Normotensive to slightly hypertensive on admission. Home meds include norvasc and losartan/HCTZ. Pressures 156-187/85-95 overnight, receiving 10 mg hydralazine last night -PRN hydralazine SBP>160 / DBP >110  Gout: Patient unable to give history of most recent flair. No signs of acute flair at present. Patient reports compliance with this medication. -consider restarting home allopurinol   Pre-Diabetes: has not been diagnosed outpatient, A1C checked in hospital is 6.4 -follow up outpatient, consider starting oral medications -glucose on BMETs in 100's  FEN/GI: renal diet Prophylaxis: heparin  Disposition: continued management of uremia in stepdown  Subjective:  Pt resting in bed, sister and father at bedside. No complaints currently. Encouraged that creatinine is coming down so well.   Objective: Temp:  [97.7 F (36.5 C)-98.6 F (37 C)] 98 F (36.7 C) (09/28 0734) Pulse Rate:  [86-97] 88 (  09/28 0734) Resp:  [18-22] 18 (09/28 0734) BP: (156-187)/(84-97)  161/96 (09/28 0734) SpO2:  [94 %-99 %] 98 % (09/28 0734) Physical Exam: General: Overweight male lying in bed, in no acute distress Cardiovascular: RRR no murmurs rubs or gallops, +2 radial pulses bilaterally Respiratory: CTAB without increased work of breathing Gastrointestinal: SNTND, +BS  MSK: no pitting edema of LE Derm: no rashes on exposed skin, no wounds Psych: mood and affect appropriate  Laboratory:  Recent Labs Lab 02/27/16 2004 02/28/16 0516 02/29/16 0424  WBC 28.7* 26.8* 20.8*  HGB 14.0 13.9 14.0  HCT 37.9* 38.1* 39.3  PLT 406* 378 345    Recent Labs Lab 02/26/16 2129  02/27/16 2004 02/28/16 0516 02/29/16 0424  NA 130*  < > 134* 135 134*  K 4.6  < > 3.3* 3.7 3.2*  CL 88*  < > 96* 95* 96*  CO2 <7*  < > 15* 17* 20*  BUN 272*  < > 161* 168* 162*  CREATININE 21.80*  < > 11.57* 10.42* 6.88*  CALCIUM 6.3*  < > 7.8* 8.2* 8.6*  PROT 6.9  --   --  7.4 7.2  BILITOT 0.6  --   --  0.8 1.0  ALKPHOS 64  --   --  71 66  ALT 103*  --   --  81* 65*  AST 37  --   --  30 30  GLUCOSE 104*  < > 166* 173* 113*  < > = values in this interval not displayed. CK 5,400 Urine protein/Cr ratio 0.26 UA - squam 0-5; few bacteria, hyaline casts; cloudy, moderate hemoglobin, + protein  Imaging/Diagnostic Tests: Dg Chest Port 1 View  Result Date: 02/29/2016 CLINICAL DATA:  Dyspnea EXAM: PORTABLE CHEST 1 VIEW COMPARISON:  02/27/2016 FINDINGS: Cardiomediastinal silhouette is stable. There is right IJ central catheter with tip in distal SVC. No pneumothorax. Persistent patchy infiltrate in right upper lobe and right middle lobe. No pulmonary edema. IMPRESSION: Right IJ central line in place. No pneumothorax. Persistent patchy infiltrate in right upper and right middle lobe. Electronically Signed   By: Lahoma Crocker M.D.   On: 02/29/2016 08:39    Steve Rattler, DO 02/29/2016, 10:06 AM PGY-1, [place] Intern pager: [phone], text pages welcome

## 2016-02-29 NOTE — Progress Notes (Signed)
Report called to RN pt to transfer to [id number] via w/c with belongings.

## 2016-02-29 NOTE — Progress Notes (Signed)
Subjective: Interval History: has complaints sleepy, but will do HD if needed.  Objective: Vital signs in last 24 hours: Temp:  [97.7 F (36.5 C)-98.6 F (37 C)] 98 F (36.7 C) (09/28 0734) Pulse Rate:  [86-97] 88 (09/28 0734) Resp:  [18-22] 18 (09/28 0734) BP: (156-187)/(84-97) 161/96 (09/28 0734) SpO2:  [94 %-99 %] 98 % (09/28 0734) Weight change:   Intake/Output from previous day: 09/27 0701 - 09/28 0700 In: 960 [P.O.:960] Out: 2500 [Urine:2500] Intake/Output this shift: Total I/O In: 240 [P.O.:240] Out: -   General appearance: cooperative, moderately obese and slowed mentation Neck: RIJ cath Resp: diminished breath sounds bilaterally Cardio: S1, S2 normal and systolic murmur: holosystolic 2/6, blowing at apex GI: obese, pos bs, soft Extremities: extremities normal, atraumatic, no cyanosis or edema  Lab Results:  Recent Labs  02/28/16 0516 02/29/16 0424  WBC 26.8* 20.8*  HGB 13.9 14.0  HCT 38.1* 39.3  PLT 378 345   BMET:  Recent Labs  02/28/16 0516 02/29/16 0424  NA 135 134*  K 3.7 3.2*  CL 95* 96*  CO2 17* 20*  GLUCOSE 173* 113*  BUN 168* 162*  CREATININE 10.42* 6.88*  CALCIUM 8.2* 8.6*   No results for input(s): PTH in the last 72 hours. Iron Studies: No results for input(s): IRON, TIBC, TRANSFERRIN, FERRITIN in the last 72 hours.  Studies/Results: Ct Chest Wo Contrast  Result Date: 02/27/2016 CLINICAL DATA:  45 year old male with pulmonary opacities seen on recent chest radiographs. Nonsmoker. Denies pain nor dyspnea. Uncontrollable hiccups intermittently. EXAM: CT CHEST WITHOUT CONTRAST TECHNIQUE: Multidetector CT imaging of the chest was performed following the standard protocol without IV contrast. COMPARISON:  Chest radiographs dating back through 02/26/2016 FINDINGS: Cardiovascular: Top normal size cardiac chambers. No pericardial effusion nor significant pericardial thickening. Right IJ catheter terminates in the distal SVC. Mediastinum/Nodes:  Small 1 cm or less in short axis paratracheal and right hilar lymph nodes. No axillary or supraclavicular adenopathy. Lungs/Pleura: Right upper lobe and right middle lobe confluent and ground-glass airspace opacities some of which demonstrate air bronchograms and do not violate pleural boundaries. Findings are likely infectious in etiology. No pleural effusion or pneumothorax. Upper Abdomen: Negative Musculoskeletal: Bilateral gynecomastia. Respiratory motion artifacts account for appearance of a lower sternal fracture on the sagittal re-formatted images. No acute osseous abnormality or is noted. IMPRESSION: Confluent right upper and middle lobe pulmonary consolidations and ground-glass opacities with air bronchograms suggestive of an infectious etiology. Reactive mediastinal and hilar lymph nodes. Follow up chest imaging in 1-2 months is recommended to assure stability and/or resolution. Electronically Signed   By: Tollie Ethavid  Kwon M.D.   On: 02/27/2016 12:13   Dg Chest Port 1 View  Result Date: 02/29/2016 CLINICAL DATA:  Dyspnea EXAM: PORTABLE CHEST 1 VIEW COMPARISON:  02/27/2016 FINDINGS: Cardiomediastinal silhouette is stable. There is right IJ central catheter with tip in distal SVC. No pneumothorax. Persistent patchy infiltrate in right upper lobe and right middle lobe. No pulmonary edema. IMPRESSION: Right IJ central line in place. No pneumothorax. Persistent patchy infiltrate in right upper and right middle lobe. Electronically Signed   By: Natasha MeadLiviu  Pop M.D.   On: 02/29/2016 08:39    I have reviewed the patient's current medications.  Assessment/Plan: 1 AKI nonoliguric and showing some function.  Acid/base better,  K low, will replete.  Etiology this apparent ATN ?? Losartan plus NSAIDS.Not sure there is not something else 2 HTN do not lower as will diurese 3 ^ WBC 4 Obesity P diet,follow Cr, avoid  ARB, NSAID in future    LOS: 3 days   , L 02/29/2016,9:06 AM

## 2016-02-29 NOTE — Progress Notes (Signed)
Name: Harold Rodriguez MRN: [id number] DOB: 03-Apr-1971    ADMISSION DATE:  02/26/2016 CONSULTATION DATE:  9/27    REFERRING MD :  FPTS  CHIEF COMPLAINT:  Abnormal CT   BRIEF PATIENT DESCRIPTION: 45yo male with hx HTN and gout initially admitted 9/25 with dyspnea, metabolic acidosis and acute renal failure with SCr 21, phos 19.8 on admission.  U/a without concern for infection, negative renal u/s.  WBC notable at 26.  He was started on dialysis and seen in consultation by renal who feel this is likely ANT in setting volume depletion on ARB.  Had ongoing dyspnea and CT chest revealed RUL and RML ground glass opacities and reactive hilar and mediastinal lymph nodes and PCCM was consulted to assist.   SIGNIFICANT EVENTS    STUDIES:  Renal u/s 9/26>>> Unremarkable renal ultrasound.  Fatty liver. CT chest 9/26>>> Confluent right upper and middle lobe pulmonary consolidations and ground-glass opacities with air bronchograms suggestive of an infectious etiology. Reactive mediastinal and hilar lymph nodes. UDS 9/26>> neg    Autoimmune:  HIV >>NR HCV >>neg ANA >> NEG GBM>>NEG ANCA  C3 >> 173 (H) C4 >>47 (H) Hep B surgace ag>> neg  Hep B >>neg  Hep C >>neg    HISTORY OF PRESENT ILLNESS:  45yo male with hx HTN and gout initially admitted 9/25 with dyspnea, metabolic acidosis and acute renal failure with SCr 21, phos 19.8 on admission.  U/a without concern for infection, negative renal u/s.  He was started on dialysis and seen in consultation by renal who feel this is likely ANT in setting volume depletion on ARB.  Had ongoing dyspnea and CT chest revealed RUL and RML ground glass opacities and reactive hilar and mediastinal lymph nodes and PCCM was consulted to assist.   .   SUBJECTIVE: Sleepy  VITAL SIGNS: Temp:  [97.7 F (36.5 C)-98.6 F (37 C)] 97.9 F (36.6 C) (09/28 1107) Pulse Rate:  [86-97] 89 (09/28 1107) Resp:  [15-22] 15 (09/28 1107) BP: (148-187)/(85-98) 148/98  (09/28 1107) SpO2:  [94 %-99 %] 94 % (09/28 1107)  PHYSICAL EXAMINATION: General:  Well developed male in no distress but is sleepy Neuro:  Alert and interactive, moving all ext to command. HEENT:  /AT, PERRL, EOM-I and MMM. Cardiovascular:  RRR, Nl S1/S2, -M/R/G. Lungs:  CTA bilaterally. Abdomen:  Soft, NT, ND and +BS. Musculoskeletal:  -edema and -tenderness. Skin:  Intact.   Recent Labs Lab 02/27/16 2004 02/28/16 0516 02/29/16 0424  NA 134* 135 134*  K 3.3* 3.7 3.2*  CL 96* 95* 96*  CO2 15* 17* 20*  BUN 161* 168* 162*  CREATININE 11.57* 10.42* 6.88*  GLUCOSE 166* 173* 113*    Recent Labs Lab 02/27/16 2004 02/28/16 0516 02/29/16 0424  HGB 14.0 13.9 14.0  HCT 37.9* 38.1* 39.3  WBC 28.7* 26.8* 20.8*  PLT 406* 378 345   Ct Chest Wo Contrast  Result Date: 02/27/2016 CLINICAL DATA:  45 year old male with pulmonary opacities seen on recent chest radiographs. Nonsmoker. Denies pain nor dyspnea. Uncontrollable hiccups intermittently. EXAM: CT CHEST WITHOUT CONTRAST TECHNIQUE: Multidetector CT imaging of the chest was performed following the standard protocol without IV contrast. COMPARISON:  Chest radiographs dating back through 02/26/2016 FINDINGS: Cardiovascular: Top normal size cardiac chambers. No pericardial effusion nor significant pericardial thickening. Right IJ catheter terminates in the distal SVC. Mediastinum/Nodes: Small 1 cm or less in short axis paratracheal and right hilar lymph nodes. No axillary or supraclavicular adenopathy. Lungs/Pleura: Right upper lobe and  right middle lobe confluent and ground-glass airspace opacities some of which demonstrate air bronchograms and do not violate pleural boundaries. Findings are likely infectious in etiology. No pleural effusion or pneumothorax. Upper Abdomen: Negative Musculoskeletal: Bilateral gynecomastia. Respiratory motion artifacts account for appearance of a lower sternal fracture on the sagittal re-formatted images. No  acute osseous abnormality or is noted. IMPRESSION: Confluent right upper and middle lobe pulmonary consolidations and ground-glass opacities with air bronchograms suggestive of an infectious etiology. Reactive mediastinal and hilar lymph nodes. Follow up chest imaging in 1-2 months is recommended to assure stability and/or resolution. Electronically Signed   By: Ashley Royalty M.D.   On: 02/27/2016 12:13   Dg Chest Port 1 View  Result Date: 02/29/2016 CLINICAL DATA:  Dyspnea EXAM: PORTABLE CHEST 1 VIEW COMPARISON:  02/27/2016 FINDINGS: Cardiomediastinal silhouette is stable. There is right IJ central catheter with tip in distal SVC. No pneumothorax. Persistent patchy infiltrate in right upper lobe and right middle lobe. No pulmonary edema. IMPRESSION: Right IJ central line in place. No pneumothorax. Persistent patchy infiltrate in right upper and right middle lobe. Electronically Signed   By: Lahoma Crocker M.D.   On: 02/29/2016 08:39    ASSESSMENT / PLAN:  RUL/RML ground glass opacities - unclear etiology.  DDX includes wegener's granulomatosis, TB (much less likely), ?aspiration. Eosinophils neg so not eosinophilic PNA.  DOE and SOB: at rest O2 sat of 94% on RA Acute renal failure - thought to be r/t volume depletion in setting ARB however, etiology remains puzzling.   PLAN -  HD per renal  Quantiferon gold  F/u CXR  Consider PFT's - hold off for now.  Autoimmune labs with mild inflamation Per renal if no sig improvement on Friday consider renal bx  If no renal bx will consider FOB. PCCM will be available as needed.  Richardson Landry Minor ACNP Maryanna Shape PCCM Pager [phone] till 3 pm If no answer page [phone] 02/29/2016, 11:27 AM  Attending Note:  45 year old male with PMH of HTN presenting with acute renal failure and RUL pulmonary infiltrate.  On exam, lungs are clear and patient is less labored.  I reviewed chest CT myself, infiltrate note.  Discussed with PCCM-NP and bedside RN.  RUL infiltrate:     - Interferon gold test.  - D/C PPD (unnecessary if INF gold is ordered)  - Respiratory isolation.  - Hold off bronch for now.  SOB: due to fluid status  - HD in AM  - Strict I/O.  Acute renal failure: starting to make urine.  - HD in AM.  PCCM will continue to follow.  Patient seen and examined, agree with above note.  I dictated the care and orders written for this patient under my direction.  Rush Farmer, MD [phone]

## 2016-03-01 LAB — RENAL FUNCTION PANEL
ALBUMIN: 2.9 g/dL — AB (ref 3.5–5.0)
Anion gap: 16 — ABNORMAL HIGH (ref 5–15)
BUN: 137 mg/dL — AB (ref 6–20)
CO2: 20 mmol/L — ABNORMAL LOW (ref 22–32)
CREATININE: 4.03 mg/dL — AB (ref 0.61–1.24)
Calcium: 9.8 mg/dL (ref 8.9–10.3)
Chloride: 99 mmol/L — ABNORMAL LOW (ref 101–111)
GFR calc Af Amer: 19 mL/min — ABNORMAL LOW (ref >60)
GFR, EST NON AFRICAN AMERICAN: 17 mL/min — AB (ref >60)
Glucose, Bld: 175 mg/dL — ABNORMAL HIGH (ref 65–99)
PHOSPHORUS: 5.2 mg/dL — AB (ref 2.5–4.6)
Potassium: 3.5 mmol/L (ref 3.5–5.1)
Sodium: 135 mmol/L (ref 135–145)

## 2016-03-01 LAB — CBC
HCT: 42.9 % (ref 39.0–52.0)
Hemoglobin: 14.8 g/dL (ref 13.0–17.0)
MCH: 25.8 pg — ABNORMAL LOW (ref 26.0–34.0)
MCHC: 34.5 g/dL (ref 30.0–36.0)
MCV: 74.7 fL — ABNORMAL LOW (ref 78.0–100.0)
PLATELETS: 330 10*3/uL (ref 150–400)
RBC: 5.74 MIL/uL (ref 4.22–5.81)
RDW: 16.5 % — AB (ref 11.5–15.5)
WBC: 17 10*3/uL — AB (ref 4.0–10.5)

## 2016-03-01 LAB — CK: CK TOTAL: 217 U/L (ref 49–397)

## 2016-03-01 LAB — CARDIOLIPIN ANTIBODIES, IGG, IGM, IGA: Anticardiolipin IgM: 11 MPL U/mL (ref 0–12)

## 2016-03-01 LAB — OCCULT BLOOD X 1 CARD TO LAB, STOOL: Fecal Occult Bld: NEGATIVE

## 2016-03-01 MED ORDER — AMLODIPINE BESYLATE 5 MG PO TABS
5.0000 mg | ORAL_TABLET | Freq: Every day | ORAL | Status: DC
  Administered 2016-03-01 – 2016-03-04 (×4): 5 mg via ORAL
  Filled 2016-03-01 (×4): qty 1

## 2016-03-01 NOTE — Progress Notes (Signed)
Family Medicine Teaching Service Daily Progress Note Intern Pager: [phone]  Patient name: Harold Rodriguez Medical record number: [id number] Date of birth: 1970-08-19 Age: 45 y.o. Gender: male  Primary Care Provider: [facility] Consultants: nephrology Code Status: DNI  Pt Overview and Major Events to Date:  02/26/16 - presented to ED with tachypnea and compensated metabolic acidosis, Cr 22; nephro consulted, IJ cath placed, HD done 9/27 - pulm consulted 2/2 chest CT findings, quant gold sent, considering bronch  Assessment and Plan: Decoda Van is a 45 y.o. male presenting with dysnpea, metabolic acidosis, and acute renal failure. PMH is significant for HTN, gout.   Acute Renal Failure: Cr 21.04 on admission. Phosphorus 19.8. Care everywhere Cr 09/2015 1.25. Past medical history only significant for HTN and gout. Pt reports SBPs in 180s when he checks, but this is quite a rapid change in kidney function from only HTN. Bladder scan for only 20cc of urine in ED (although patient reports making urine at home), making obstruction at level of bladder unlikely. Patient also denies seeing any blood in urine. Ck 5,000 > 1635. UA without concern for infection, + hyaline casts, which are nonspecific, and + protein. HCV and HIV neg. Hep B surface antigen neg. C3 and C4 complement mildly elevated. LDH and haptoglobin elevated > all of these are acute phase reactants in the setting of either nephritis or possible infection as below. Anti scleroderma normal, CRP and ESR elevated, ACE normal. -nephrology following, appreciate recs: thinking ATN in the settting of volume depletion on arb -trend CMPs  Shortness of breath:  No history of lung disease. Presented to PCP on 9/19 with this concern, was given prednisone. Given metabolic acidosis in setting of acute kidney failure, patient is likely tachypneic due to excessive acid (BUN 272 on CMP; ABG pH 7.114, pCO2 20.4, bicarb  6.5). Saturating 100% on room air. CXR with scattered peribronchial nodular and ground-glass opacities, more prominent in the right lung.  -blood cultures ordered, will hold off on empiric antibiotics for now -continue to monitor after HD; continue aggressive treatment of uremia -Ct chest concerning for infectious source in RUL and RML -pulm consulted, appreciate recs  HTN: Normotensive to slightly hypertensive on admission. Home meds include norvasc and losartan/HCTZ.  -PRN hydralazine SBP>160 / DBP >110  Gout: Patient unable to give history of most recent flair. No signs of acute flair at present. Patient reports compliance with this medication. -consider restarting home allopurinol   FEN/GI: renal diet Prophylaxis: heparin  Disposition: continued management of uremia  Subjective:  Pt resting comfortably, anticipating HD today. Reports dark stools and thirst.  Objective: Temp:  [97.7 F (36.5 C)-98.8 F (37.1 C)] 97.7 F (36.5 C) (09/29 0628) Pulse Rate:  [87-101] 87 (09/29 0628) Resp:  [15-19] 17 (09/29 0628) BP: (148-163)/(94-106) 163/106 (09/29 0628) SpO2:  [94 %-99 %] 99 % (09/29 0628) Weight:  [229 lb 4.5 oz (104 kg)] 229 lb 4.5 oz (104 kg) (09/28 1913) Physical Exam: General: Overweight male lying in bed, tachypneic. Neck: supple, no JVD Cardiovascular: rrr, no m/r/g, no edema Respiratory: CTAB, good air movement, tachypneic without increased work of breathing Gastrointestinal: SNTND, +BS MSK: strength 5/5 in all extremities, no pitting edema of LE Derm: no rashes on exposed skin, no wounds Neuro: CN II-XII grossly intact, sensation intact; AOx4 Psych: mood and affect appropriate  Laboratory:  Recent Labs Lab 02/27/16 2004 02/28/16 0516 02/29/16 0424  WBC 28.7* 26.8* 20.8*  HGB 14.0 13.9 14.0  HCT  37.9* 38.1* 39.3  PLT 406* 378 345    Recent Labs Lab 02/26/16 2129  02/27/16 2004 02/28/16 0516 02/29/16 0424  NA 130*  < > 134* 135 134*  K 4.6  <  > 3.3* 3.7 3.2*  CL 88*  < > 96* 95* 96*  CO2 <7*  < > 15* 17* 20*  BUN 272*  < > 161* 168* 162*  CREATININE 21.80*  < > 11.57* 10.42* 6.88*  CALCIUM 6.3*  < > 7.8* 8.2* 8.6*  PROT 6.9  --   --  7.4 7.2  BILITOT 0.6  --   --  0.8 1.0  ALKPHOS 64  --   --  71 66  ALT 103*  --   --  81* 65*  AST 37  --   --  30 30  GLUCOSE 104*  < > 166* 173* 113*  < > = values in this interval not displayed. CK 5,400 Urine protein/Cr ratio 0.26 UA - squam 0-5; few bacteria, hyaline casts; cloudy, moderate hemoglobin, + protein  Imaging/Diagnostic Tests: Dg Chest 2 View  Result Date: 02/26/2016 CLINICAL DATA:  Weakness, fatigue, fever and shortness of breath for 1 week. EXAM: CHEST  2 VIEW COMPARISON:  None. FINDINGS: Cardiomediastinal silhouette is normal. Mediastinal contours appear intact. There is no evidence of pleural effusion or pneumothorax. Scattered peribronchial nodular and ground-glass opacities are seen throughout both lungs, more prominent on the right. Osseous structures are without acute abnormality. Soft tissues are grossly normal. IMPRESSION: Scattered peribronchial nodular and ground-glass opacities, more prominent in the right lung. Differential diagnosis includes small airway disease, acute pneumonitis or less likely development of interstitial pulmonary edema. Electronically Signed   By: Fidela Salisbury M.D.   On: 02/26/2016 13:54   Ct Chest Wo Contrast  Result Date: 02/27/2016 CLINICAL DATA:  45 year old male with pulmonary opacities seen on recent chest radiographs. Nonsmoker. Denies pain nor dyspnea. Uncontrollable hiccups intermittently. EXAM: CT CHEST WITHOUT CONTRAST TECHNIQUE: Multidetector CT imaging of the chest was performed following the standard protocol without IV contrast. COMPARISON:  Chest radiographs dating back through 02/26/2016 FINDINGS: Cardiovascular: Top normal size cardiac chambers. No pericardial effusion nor significant pericardial thickening. Right IJ  catheter terminates in the distal SVC. Mediastinum/Nodes: Small 1 cm or less in short axis paratracheal and right hilar lymph nodes. No axillary or supraclavicular adenopathy. Lungs/Pleura: Right upper lobe and right middle lobe confluent and ground-glass airspace opacities some of which demonstrate air bronchograms and do not violate pleural boundaries. Findings are likely infectious in etiology. No pleural effusion or pneumothorax. Upper Abdomen: Negative Musculoskeletal: Bilateral gynecomastia. Respiratory motion artifacts account for appearance of a lower sternal fracture on the sagittal re-formatted images. No acute osseous abnormality or is noted. IMPRESSION: Confluent right upper and middle lobe pulmonary consolidations and ground-glass opacities with air bronchograms suggestive of an infectious etiology. Reactive mediastinal and hilar lymph nodes. Follow up chest imaging in 1-2 months is recommended to assure stability and/or resolution. Electronically Signed   By: Ashley Royalty M.D.   On: 02/27/2016 12:13   US Renal  Result Date: 02/27/2016 CLINICAL DATA:  45 year old male with acute renal failure and increased BUN and creatinine. EXAM: RENAL / URINARY TRACT ULTRASOUND COMPLETE COMPARISON:  None. FINDINGS: Right Kidney: Length: 12.3 cm. Echogenicity within normal limits. No mass or hydronephrosis visualized. Left Kidney: Length: 12.5 cm. Echogenicity within normal limits. No mass or hydronephrosis visualized. Bladder: Appears normal for degree of bladder distention. There is diffuse increased hepatic echogenicity likely fatty infiltration.  IMPRESSION: Unremarkable renal ultrasound. Fatty liver. Electronically Signed   By: Anner Crete M.D.   On: 02/27/2016 01:51   Dg Chest Port 1 View  Result Date: 02/29/2016 CLINICAL DATA:  Dyspnea EXAM: PORTABLE CHEST 1 VIEW COMPARISON:  02/27/2016 FINDINGS: Cardiomediastinal silhouette is stable. There is right IJ central catheter with tip in distal SVC. No  pneumothorax. Persistent patchy infiltrate in right upper lobe and right middle lobe. No pulmonary edema. IMPRESSION: Right IJ central line in place. No pneumothorax. Persistent patchy infiltrate in right upper and right middle lobe. Electronically Signed   By: Lahoma Crocker M.D.   On: 02/29/2016 08:39   Dg Chest Port 1 View  Result Date: 02/26/2016 CLINICAL DATA:  45 year old male with right-sided dialysis catheter placement. EXAM: PORTABLE CHEST 1 VIEW COMPARISON:  Chest radiograph dated 02/26/2016 FINDINGS: Single portable view of the chest demonstrate clear lungs with sharp costophrenic angles. There is no pneumothorax. There is stable mild cardiomegaly. Right IJ dialysis catheter with tip over central SVC noted. No acute osseous pathology identified. IMPRESSION: Right IJ and dialysis catheter with tip over central SVC. No pneumothorax. No acute cardiopulmonary process. Mild cardiomegaly. Electronically Signed   By: Anner Crete M.D.   On: 02/26/2016 21:27    Sela Hilding, MD 03/01/2016, 9:27 AM PGY-1, [place] Intern pager: [phone], text pages welcome

## 2016-03-01 NOTE — Progress Notes (Signed)
Subjective: Interval History: has complaints adking questions regarding his kidneys, HD.  Objective: Vital signs in last 24 hours: Temp:  [97.7 F (36.5 C)-98.8 F (37.1 C)] 97.7 F (36.5 C) (09/29 0628) Pulse Rate:  [87-101] 87 (09/29 0628) Resp:  [15-19] 17 (09/29 0628) BP: (148-163)/(94-106) 163/106 (09/29 0628) SpO2:  [94 %-99 %] 99 % (09/29 0628) Weight:  [104 kg (229 lb 4.5 oz)] 104 kg (229 lb 4.5 oz) (09/28 1913) Weight change:   Intake/Output from previous day: 09/28 0701 - 09/29 0700 In: 840 [P.O.:840] Out: 2675 [Urine:2675] Intake/Output this shift: No intake/output data recorded.  General appearance: alert, cooperative, no distress and moderately obese Neck: RIJ cath Resp: clear to auscultation bilaterally Cardio: S1, S2 normal and systolic murmur: holosystolic 2/6, blowing at apex GI: soft, pos bs, liver down 5 cm Extremities: edema 1+  Lab Results:  Recent Labs  02/28/16 0516 02/29/16 0424  WBC 26.8* 20.8*  HGB 13.9 14.0  HCT 38.1* 39.3  PLT 378 345   BMET:  Recent Labs  02/28/16 0516 02/29/16 0424  NA 135 134*  K 3.7 3.2*  CL 95* 96*  CO2 17* 20*  GLUCOSE 173* 113*  BUN 168* 162*  CREATININE 10.42* 6.88*  CALCIUM 8.2* 8.6*   No results for input(s): PTH in the last 72 hours. Iron Studies: No results for input(s): IRON, TIBC, TRANSFERRIN, FERRITIN in the last 72 hours.  Studies/Results: Dg Chest Port 1 View  Result Date: 02/29/2016 CLINICAL DATA:  Dyspnea EXAM: PORTABLE CHEST 1 VIEW COMPARISON:  02/27/2016 FINDINGS: Cardiomediastinal silhouette is stable. There is right IJ central catheter with tip in distal SVC. No pneumothorax. Persistent patchy infiltrate in right upper lobe and right middle lobe. No pulmonary edema. IMPRESSION: Right IJ central line in place. No pneumothorax. Persistent patchy infiltrate in right upper and right middle lobe. Electronically Signed   By: Natasha MeadLiviu  Pop M.D.   On: 02/29/2016 08:39    I have reviewed the  patient's current medications. Prior to Admission:  Prescriptions Prior to Admission  Medication Sig Dispense Refill Last Dose  . allopurinol (ZYLOPRIM) 100 MG tablet Take 100 mg by mouth 2 (two) times daily.   02/26/2016 at am  . amLODipine (NORVASC) 5 MG tablet Take 5 mg by mouth daily.   02/26/2016 at am  . ibuprofen (ADVIL,MOTRIN) 200 MG tablet Take 600 mg by mouth every 6 (six) hours as needed (pain).   Past Month at Unknown time  . losartan-hydrochlorothiazide (HYZAAR) 100-25 MG tablet Take 1 tablet by mouth daily.   02/26/2016 at am  . predniSONE (DELTASONE) 10 MG tablet Take 10-30 mg by mouth See admin instructions. 30 mg once a day for 3 days then 20 mg once a day for 3 days then 10 mg once a day for 3 days   02/26/2016 at am  . hydrochlorothiazide (HYDRODIURIL) 25 MG tablet Take 1 tablet (25 mg total) by mouth daily. (Patient not taking: Reported on 02/27/2016) 30 tablet 0 Not Taking at Unknown time  . predniSONE (DELTASONE) 50 MG tablet Take daily with breakfast (Patient not taking: Reported on 02/27/2016) 7 tablet 0 Not Taking at Unknown time    Assessment/Plan: 1 AKI nonoliguric, chem pending. [place].   2 Pulm per pulm 3 obesity 4 gout P check chem, diet, counseled    LOS: 4 days   , L 03/01/2016,9:21 AM

## 2016-03-02 LAB — RENAL FUNCTION PANEL
ALBUMIN: 2.8 g/dL — AB (ref 3.5–5.0)
Anion gap: 13 (ref 5–15)
BUN: 107 mg/dL — AB (ref 6–20)
CALCIUM: 9.6 mg/dL (ref 8.9–10.3)
CO2: 23 mmol/L (ref 22–32)
CREATININE: 2.71 mg/dL — AB (ref 0.61–1.24)
Chloride: 100 mmol/L — ABNORMAL LOW (ref 101–111)
GFR calc Af Amer: 31 mL/min — ABNORMAL LOW (ref >60)
GFR calc non Af Amer: 27 mL/min — ABNORMAL LOW (ref >60)
GLUCOSE: 116 mg/dL — AB (ref 65–99)
PHOSPHORUS: 4.7 mg/dL — AB (ref 2.5–4.6)
Potassium: 3.5 mmol/L (ref 3.5–5.1)
SODIUM: 136 mmol/L (ref 135–145)

## 2016-03-02 LAB — CBC
HCT: 41.2 % (ref 39.0–52.0)
Hemoglobin: 14.1 g/dL (ref 13.0–17.0)
MCH: 25.7 pg — AB (ref 26.0–34.0)
MCHC: 34.2 g/dL (ref 30.0–36.0)
MCV: 75 fL — ABNORMAL LOW (ref 78.0–100.0)
PLATELETS: 292 10*3/uL (ref 150–400)
RBC: 5.49 MIL/uL (ref 4.22–5.81)
RDW: 16.2 % — ABNORMAL HIGH (ref 11.5–15.5)
WBC: 13.1 10*3/uL — ABNORMAL HIGH (ref 4.0–10.5)

## 2016-03-02 MED ORDER — ALLOPURINOL 100 MG PO TABS
100.0000 mg | ORAL_TABLET | Freq: Two times a day (BID) | ORAL | Status: DC
  Administered 2016-03-02 – 2016-03-04 (×5): 100 mg via ORAL
  Filled 2016-03-02 (×5): qty 1

## 2016-03-02 NOTE — Discharge Summary (Signed)
Family Medicine Teaching [facility] Discharge Summary  Patient name: Harold Rodriguez Medical record number: [id number] Date of birth: 1970-09-02 Age: 45 y.o. Gender: male Date of Admission: 02/26/2016  Date of Discharge: 03/04/2016 Admitting Physician: Uvaldo Rising, MD  Primary Care Provider: [facility] Consultants: nephrology, pulm  Indication for Hospitalization: acute renal failure  Discharge Diagnoses/Problem List:  Acute renal failure HTN Gout  Disposition: Home  Discharge Condition: Stable, improved  Discharge Exam:  General: Overweight male lying in bed, in no acute distress Cardiovascular: rrr, no m/r/g, no edema Respiratory: CTAB, good air movement Gastrointestinal: SNTND, +BS MSK: strength 5/5 in all extremities, no pitting edema of LE Derm: no rashes on exposed skin, no wounds Neuro: CN II-XII grossly intact, sensation intact; AOx4 Psych: mood and affect appropriate  Brief Hospital Course:  Patient presented with 2 weeks of feeling unwell and shortness of breath. PMH of HTN and gout. Was taking losartan HCTZ prior to admission, but SBPs poorly controlled in 170s-180s. Short of breath x 2 weeks as an outpatient, given prednisone. Patient was satting well on RA 100%.  CXR showed scattered peribronchial nodular and ground-glass opacities more prominent in right lung.  Blood cultures showed NGTD.  CT chest concerning for infectious source possibly TB.  Quantiferon gold ordered and Pulmonology and ID consulted.  Patient remained afebrile with no risk factors for TB and no respiratory symptoms. At time of discharge respiratory status at baseline and stable.    Also, found to have metabolic acidosis in setting of acute kidney failure. Nephrology consulted and suspected ATN in the setting of volume depletion on ARB medication.  Creatine improved without hemodialysis over next few days and no HD was required.  Nephrology will follow  outpatient.  Creatine at discharge was 1.93 and patient stable.    Issues for Follow Up:  1. Continued nephrology followup as an outpatient. 2. Will need repeat CT in 4-6 weeks to ensure clearance of RUL airspace disease  Significant Procedures: R IJ cath inserted on evening of admission, removed 9/30  Significant Labs and Imaging:   Recent Labs Lab 03/02/16 0427 03/03/16 0722 03/04/16 0515  WBC 13.1* 10.3 8.8  HGB 14.1 14.2 12.9*  HCT 41.2 41.2 38.4*  PLT 292 272 251    Recent Labs Lab 02/26/16 1745  02/26/16 2129  02/28/16 0516 02/29/16 0424 03/01/16 0925 03/02/16 0427 03/03/16 0722 03/04/16 0515  NA  --   < > 130*  < > 135 134* 135 136 135 133*  K  --   < > 4.6  < > 3.7 3.2* 3.5 3.5 3.6 3.4*  CL  --   < > 88*  < > 95* 96* 99* 100* 99* 97*  CO2  --   < > <7*  < > 17* 20* 20* 23 23 23   GLUCOSE  --   < > 104*  < > 173* 113* 175* 116* 141* 140*  BUN  --   < > 272*  < > 168* 162* 137* 107* 69* 49*  CREATININE  --   < > 21.80*  < > 10.42* 6.88* 4.03* 2.71* 2.12* 1.93*  CALCIUM  --   < > 6.3*  < > 8.2* 8.6* 9.8 9.6 9.6 9.4  MG  --   --  3.2*  --   --   --   --   --   --   --   PHOS  --   --  19.8*  < > 9.2*  --  5.2* 4.7* 3.7 3.6  ALKPHOS 72  --  64  --  71 66  --   --   --   --   AST 40  --  37  --  30 30  --   --   --   --   ALT 111*  --  103*  --  81* 65*  --   --   --   --   ALBUMIN 2.9*  --  2.6*  < > 2.7* 2.6* 2.9* 2.8* 3.0* 2.9*  < > = values in this interval not displayed.  Results/Tests Pending at Time of Discharge: Quantiferon gold TB test  Discharge Medications:    Medication List    STOP taking these medications   hydrochlorothiazide 25 MG tablet Commonly known as:  HYDRODIURIL   losartan-hydrochlorothiazide 100-25 MG tablet Commonly known as:  HYZAAR   predniSONE 10 MG tablet Commonly known as:  DELTASONE   predniSONE 50 MG tablet Commonly known as:  DELTASONE     TAKE these medications   allopurinol 100 MG tablet Commonly known as:   ZYLOPRIM Take 100 mg by mouth 2 (two) times daily.   amLODipine 5 MG tablet Commonly known as:  NORVASC Take 5 mg by mouth daily.   ibuprofen 200 MG tablet Commonly known as:  ADVIL,MOTRIN Take 600 mg by mouth every 6 (six) hours as needed (pain).      Discharge Instructions: Please refer to Patient Instructions section of EMR for full details.  Patient was counseled important signs and symptoms that should prompt return to medical care, changes in medications, dietary instructions, activity restrictions, and follow up appointments.   Follow-Up Appointments: Follow-up Information    Chilton GreathousePraveen Mannam, MD Follow up on 04/08/2016.   Specialty:  Pulmonary Disease Why:  Appt at 9:15 AM to follow up regarding CT findings from hospitalization Contact information: [address] [place] [address] [phone]        Lovena NeighboursAbdoulaye Diallo, MD. Go on 03/14/2016.   Specialty:  Family Medicine Why:  Hospital follow up appointment with Dr. Sydnee Cabaliallo at 1.30pm. Contact information: [address] [place] [address] [phone]          Freddrick MarchYashika Amin, MD 03/04/2016, 4:47 PM PGY-1, [facility]

## 2016-03-02 NOTE — Progress Notes (Signed)
Subjective: Interval History: has no complaint glad to not have to do dialysis.  Objective: Vital signs in last 24 hours: Temp:  [98 F (36.7 C)-98.2 F (36.8 C)] 98.2 F (36.8 C) (09/30 16100642) Pulse Rate:  [85-94] 85 (09/30 0642) Resp:  [18-19] 19 (09/29 2230) BP: (152-161)/(96-100) 152/100 (09/30 0646) SpO2:  [97 %-100 %] 97 % (09/30 0642) Weight change:   Intake/Output from previous day: 09/29 0701 - 09/30 0700 In: 620 [P.O.:620] Out: 2850 [Urine:2550; Stool:300] Intake/Output this shift: Total I/O In: -  Out: 450 [Urine:450]  General appearance: alert, cooperative, no distress and moderately obese Neck: RIJ cath Resp: clear to auscultation bilaterally Cardio: S1, S2 normal and systolic murmur: holosystolic 2/6, blowing at apex GI: obese, pos bs,soft Extremities: extremities normal, atraumatic, no cyanosis or edema  Lab Results:  Recent Labs  03/01/16 0925 03/02/16 0427  WBC 17.0* 13.1*  HGB 14.8 14.1  HCT 42.9 41.2  PLT 330 292   BMET:  Recent Labs  03/01/16 0925 03/02/16 0427  NA 135 136  K 3.5 3.5  CL 99* 100*  CO2 20* 23  GLUCOSE 175* 116*  BUN 137* 107*  CREATININE 4.03* 2.71*  CALCIUM 9.8 9.6   No results for input(s): PTH in the last 72 hours. Iron Studies: No results for input(s): IRON, TIBC, TRANSFERRIN, FERRITIN in the last 72 hours.  Studies/Results: No results found.  I have reviewed the patient's current medications.  Assessment/Plan: 1 AKI cont to improve.  Acidemia better as well as solute.  Can get catheter out.  2 Pulm infilt per pulm 3 Gout can start Allopurinol 4 obesity P d/c HD cath, can d/c any time and f/u outpatient from our standpoint    LOS: 5 days   , L 03/02/2016,9:12 AM

## 2016-03-02 NOTE — Progress Notes (Signed)
Family Medicine Teaching Service Daily Progress Note Intern Pager: [phone]  Patient name: Harold Rodriguez Medical record number: [id number] Date of birth: 20-Jul-1970 Age: 45 y.o. Gender: male  Primary Care Provider: [place] Consultants: nephrology Code Status: DNI  Pt Overview and Major Events to Date:  02/26/16 - presented to ED with tachypnea and compensated metabolic acidosis, Cr 22; nephro consulted, IJ cath placed, HD done 9/27 - pulm consulted 2/2 chest CT findings, quant gold sent, considering bronch  Assessment and Plan: Harold Rodriguez is a 45 y.o. male presenting with dysnpea, metabolic acidosis, and acute renal failure. PMH is significant for HTN, gout.   Acute Renal Failure: Cr 21.04 on admission. Phosphorus 19.8. Care everywhere Cr 09/2015 1.25. Past medical history only significant for HTN and gout. Pt reports SBPs in 180s when he checks, but this is quite a rapid change in kidney function from only HTN. Bladder scan for only 20cc of urine in ED (although patient reports making urine at home), making obstruction at level of bladder unlikely. Patient also denies seeing any blood in urine. Ck 5,000 > 1635 > normal 217. UA without concern for infection, + hyaline casts, which are nonspecific, and + protein. HCV and HIV neg. Hep B surface antigen neg. C3 and C4 complement mildly elevated. LDH and haptoglobin elevated > all of these are acute phase reactants in the setting of either nephritis or possible infection as below. Anti scleroderma normal, CRP and ESR elevated, ACE normal. Very pleased with declining Cr without dialysis x 3 days, hopeful that this will mean he will no longer need HD. -nephrology following, appreciate recs: thinking ATN in the settting of volume depletion on arb -trend CMPs  Shortness of breath:  No history of lung disease. Presented to PCP on 9/19 with this concern, was given prednisone. Given metabolic acidosis in setting  of acute kidney failure, patient is likely tachypneic due to excessive acid (BUN 272 on CMP; ABG pH 7.114, pCO2 20.4, bicarb 6.5). Saturating 100% on room air. CXR with scattered peribronchial nodular and ground-glass opacities, more prominent in the right lung. Ct chest concerning for infectious source in RUL and RML. -blood cultures ordered, will hold off on empiric antibiotics for now -pulm consulted, appreciate recs -quantiferon gold pending  HTN: Hypertensive 150-160/90-100 Home meds include norvasc and losartan/HCTZ.  -PRN hydralazine SBP>160 / DBP >110 -added norvasc 57m 9/29  Gout: Patient unable to give history of most recent flair. No signs of acute flair at present. Patient reports compliance with this medication. -consider restarting home allopurinol   FEN/GI: renal diet Prophylaxis: heparin  Disposition: continued management acute renal failure  Subjective:  Pt resting comfortably, appreciative of our care. Questions about when he can go back to work and future of HD, reassured him that he is getting much better without HD over the past few days. He denies any issue with cost of his meds at home, endorses compliance. Also adds that he was frequently taking unisom for sleep at home.  Objective: Temp:  [98 F (36.7 C)-98.2 F (36.8 C)] 98.2 F (36.8 C) (09/30 0642) Pulse Rate:  [85-94] 85 (09/30 0642) Resp:  [18-19] 19 (09/29 2230) BP: (152-161)/(96-100) 152/100 (09/30 0646) SpO2:  [97 %-100 %] 97 % (09/30 04580 Physical Exam: General: Overweight male lying in bed, tachypneic. Cardiovascular: rrr, no m/r/g, no edema Respiratory: CTAB, good air movement Gastrointestinal: SNTND, +BS MSK: strength 5/5 in all extremities, no pitting edema of LE Derm: no rashes on exposed  skin, no wounds Neuro: CN II-XII grossly intact, sensation intact; AOx4 Psych: mood and affect appropriate  Laboratory:  Recent Labs Lab 02/29/16 0424 03/01/16 0925 03/02/16 0427  WBC 20.8*  17.0* 13.1*  HGB 14.0 14.8 14.1  HCT 39.3 42.9 41.2  PLT 345 330 292    Recent Labs Lab 02/26/16 2129  02/28/16 0516 02/29/16 0424 03/01/16 0925 03/02/16 0427  NA 130*  < > 135 134* 135 136  K 4.6  < > 3.7 3.2* 3.5 3.5  CL 88*  < > 95* 96* 99* 100*  CO2 <7*  < > 17* 20* 20* 23  BUN 272*  < > 168* 162* 137* 107*  CREATININE 21.80*  < > 10.42* 6.88* 4.03* 2.71*  CALCIUM 6.3*  < > 8.2* 8.6* 9.8 9.6  PROT 6.9  --  7.4 7.2  --   --   BILITOT 0.6  --  0.8 1.0  --   --   ALKPHOS 64  --  71 66  --   --   ALT 103*  --  81* 65*  --   --   AST 37  --  30 30  --   --   GLUCOSE 104*  < > 173* 113* 175* 116*  < > = values in this interval not displayed. CK 5,400 Urine protein/Cr ratio 0.26 UA - squam 0-5; few bacteria, hyaline casts; cloudy, moderate hemoglobin, + protein  Imaging/Diagnostic Tests: Ct Chest Wo Contrast  Result Date: 02/27/2016 CLINICAL DATA:  45 year old male with pulmonary opacities seen on recent chest radiographs. Nonsmoker. Denies pain nor dyspnea. Uncontrollable hiccups intermittently. EXAM: CT CHEST WITHOUT CONTRAST TECHNIQUE: Multidetector CT imaging of the chest was performed following the standard protocol without IV contrast. COMPARISON:  Chest radiographs dating back through 02/26/2016 FINDINGS: Cardiovascular: Top normal size cardiac chambers. No pericardial effusion nor significant pericardial thickening. Right IJ catheter terminates in the distal SVC. Mediastinum/Nodes: Small 1 cm or less in short axis paratracheal and right hilar lymph nodes. No axillary or supraclavicular adenopathy. Lungs/Pleura: Right upper lobe and right middle lobe confluent and ground-glass airspace opacities some of which demonstrate air bronchograms and do not violate pleural boundaries. Findings are likely infectious in etiology. No pleural effusion or pneumothorax. Upper Abdomen: Negative Musculoskeletal: Bilateral gynecomastia. Respiratory motion artifacts account for appearance of a  lower sternal fracture on the sagittal re-formatted images. No acute osseous abnormality or is noted. IMPRESSION: Confluent right upper and middle lobe pulmonary consolidations and ground-glass opacities with air bronchograms suggestive of an infectious etiology. Reactive mediastinal and hilar lymph nodes. Follow up chest imaging in 1-2 months is recommended to assure stability and/or resolution. Electronically Signed   By: Ashley Royalty M.D.   On: 02/27/2016 12:13   Dg Chest Port 1 View  Result Date: 02/29/2016 CLINICAL DATA:  Dyspnea EXAM: PORTABLE CHEST 1 VIEW COMPARISON:  02/27/2016 FINDINGS: Cardiomediastinal silhouette is stable. There is right IJ central catheter with tip in distal SVC. No pneumothorax. Persistent patchy infiltrate in right upper lobe and right middle lobe. No pulmonary edema. IMPRESSION: Right IJ central line in place. No pneumothorax. Persistent patchy infiltrate in right upper and right middle lobe. Electronically Signed   By: Lahoma Crocker M.D.   On: 02/29/2016 08:39    Sela Hilding, MD 03/02/2016, 8:04 AM PGY-1, [place] Intern pager: [phone], text pages welcome

## 2016-03-03 ENCOUNTER — Inpatient Hospital Stay (HOSPITAL_COMMUNITY): Payer: BLUE CROSS/BLUE SHIELD

## 2016-03-03 LAB — RENAL FUNCTION PANEL
Albumin: 3 g/dL — ABNORMAL LOW (ref 3.5–5.0)
Anion gap: 13 (ref 5–15)
BUN: 69 mg/dL — AB (ref 6–20)
CHLORIDE: 99 mmol/L — AB (ref 101–111)
CO2: 23 mmol/L (ref 22–32)
Calcium: 9.6 mg/dL (ref 8.9–10.3)
Creatinine, Ser: 2.12 mg/dL — ABNORMAL HIGH (ref 0.61–1.24)
GFR calc Af Amer: 42 mL/min — ABNORMAL LOW (ref >60)
GFR, EST NON AFRICAN AMERICAN: 36 mL/min — AB (ref >60)
GLUCOSE: 141 mg/dL — AB (ref 65–99)
POTASSIUM: 3.6 mmol/L (ref 3.5–5.1)
Phosphorus: 3.7 mg/dL (ref 2.5–4.6)
Sodium: 135 mmol/L (ref 135–145)

## 2016-03-03 LAB — CULTURE, BLOOD (ROUTINE X 2)
CULTURE: NO GROWTH
Culture: NO GROWTH

## 2016-03-03 LAB — CBC
HEMATOCRIT: 41.2 % (ref 39.0–52.0)
Hemoglobin: 14.2 g/dL (ref 13.0–17.0)
MCH: 26.2 pg (ref 26.0–34.0)
MCHC: 34.5 g/dL (ref 30.0–36.0)
MCV: 75.9 fL — AB (ref 78.0–100.0)
PLATELETS: 272 10*3/uL (ref 150–400)
RBC: 5.43 MIL/uL (ref 4.22–5.81)
RDW: 16 % — AB (ref 11.5–15.5)
WBC: 10.3 10*3/uL (ref 4.0–10.5)

## 2016-03-03 NOTE — Progress Notes (Signed)
Family Medicine Teaching Service Daily Progress Note Intern Pager: [phone]  Patient name: Harold Rodriguez Medical record number: [id number] Date of birth: [birth year] Age: 45 y.o. Gender: male  Primary Care Provider: [place] Consultants: nephrology Code Status: DNI  Pt Overview and Major Events to Date:  02/26/16 - presented to ED with tachypnea and compensated metabolic acidosis, Cr 22; nephro consulted, IJ cath placed, HD done 9/27 - pulm consulted 2/2 chest CT findings, quant gold sent, considering bronch  Assessment and Plan: Harold Rodriguez is a 45 y.o. male presenting with dysnpea, metabolic acidosis, and acute renal failure. PMH is significant for HTN, gout.   Acute Renal Failure: Cr 21.04 on admission. Phosphorus 19.8. Care everywhere Cr 09/2015 1.25. Past medical history only significant for HTN and gout. Pt reports SBPs in 180s when he checks, but this is quite a rapid change in kidney function from only HTN. Bladder scan for only 20cc of urine in ED (although patient reports making urine at home), making obstruction at level of bladder unlikely. Patient also denies seeing any blood in urine. Ck 5,000 > 1635 > normal 217. UA without concern for infection, + hyaline casts, which are nonspecific, and + protein. HCV and HIV neg. Hep B surface antigen neg. C3 and C4 complement mildly elevated. LDH and haptoglobin elevated > all of these are acute phase reactants in the setting of either nephritis or possible infection as below. Anti scleroderma normal, CRP and ESR elevated, ACE normal. Very pleased with declining Cr without dialysis x 3 days, hopeful that this will mean he will no longer need HD. -nephrology following, appreciate recs: thinking ATN in the settting of volume depletion on arb, no more HD, will f/u outpatient -trend CMPs: Cr down to 2.1  Shortness of breath:  No history of lung disease. Presented to PCP on 9/19 with this concern, was  given prednisone. Given metabolic acidosis in setting of acute kidney failure, patient is likely tachypneic due to excessive acid (BUN 272 on CMP; ABG pH 7.114, pCO2 20.4, bicarb 6.5). Saturating 100% on room air. CXR with scattered peribronchial nodular and ground-glass opacities, more prominent in the right lung. Ct chest concerning for infectious source in RUL and RML. -blood cultures ordered, will hold off on empiric antibiotics for now -pulm consulted, appreciate recs: will take a look at repeat CXR, could consider d/c today -quantiferon gold pending, will call ID if pulm says he could go  HTN: Hypertensive 150-160/90-100 Home meds include norvasc and losartan/HCTZ.  -PRN hydralazine SBP>160 / DBP >110 -added norvasc 26m 9/29  Gout: Patient unable to give history of most recent flair. No signs of acute flair at present. Patient reports compliance with this medication. -restart home allopurinol   FEN/GI: renal diet Prophylaxis: heparin  Disposition: continued management acute renal failure  Subjective:  Pt resting comfortably, would like to go home. Explained that we need to talk to to pulm and see about Tb test.   Objective: Temp:  [98.2 F (36.8 C)-98.6 F (37 C)] 98.2 F (36.8 C) (10/01 0520) Pulse Rate:  [82-95] 95 (10/01 0520) Resp:  [17-19] 17 (10/01 0520) BP: (132-153)/(94-108) 132/108 (10/01 0520) SpO2:  [100 %] 100 % (10/01 0520) Physical Exam: General: Overweight male lying in bed, tachypneic. Cardiovascular: rrr, no m/r/g, no edema Respiratory: CTAB, good air movement Gastrointestinal: SNTND, +BS MSK: strength 5/5 in all extremities, no pitting edema of LE Derm: no rashes on exposed skin, no wounds Neuro: CN II-XII grossly intact, sensation  intact; AOx4 Psych: mood and affect appropriate  Laboratory:  Recent Labs Lab 03/01/16 0925 03/02/16 0427 03/03/16 0722  WBC 17.0* 13.1* 10.3  HGB 14.8 14.1 14.2  HCT 42.9 41.2 41.2  PLT 330 292 272    Recent  Labs Lab 02/26/16 2129  02/28/16 0516 02/29/16 0424 03/01/16 0925 03/02/16 0427 03/03/16 0722  NA 130*  < > 135 134* 135 136 135  K 4.6  < > 3.7 3.2* 3.5 3.5 3.6  CL 88*  < > 95* 96* 99* 100* 99*  CO2 <7*  < > 17* 20* 20* 23 23  BUN 272*  < > 168* 162* 137* 107* 69*  CREATININE 21.80*  < > 10.42* 6.88* 4.03* 2.71* 2.12*  CALCIUM 6.3*  < > 8.2* 8.6* 9.8 9.6 9.6  PROT 6.9  --  7.4 7.2  --   --   --   BILITOT 0.6  --  0.8 1.0  --   --   --   ALKPHOS 64  --  71 66  --   --   --   ALT 103*  --  81* 65*  --   --   --   AST 37  --  30 30  --   --   --   GLUCOSE 104*  < > 173* 113* 175* 116* 141*  < > = values in this interval not displayed. CK 5,400 Urine protein/Cr ratio 0.26 UA - squam 0-5; few bacteria, hyaline casts; cloudy, moderate hemoglobin, + protein  Imaging/Diagnostic Tests: Dg Chest Port 1 View  Result Date: 02/29/2016 CLINICAL DATA:  Dyspnea EXAM: PORTABLE CHEST 1 VIEW COMPARISON:  02/27/2016 FINDINGS: Cardiomediastinal silhouette is stable. There is right IJ central catheter with tip in distal SVC. No pneumothorax. Persistent patchy infiltrate in right upper lobe and right middle lobe. No pulmonary edema. IMPRESSION: Right IJ central line in place. No pneumothorax. Persistent patchy infiltrate in right upper and right middle lobe. Electronically Signed   By: Lahoma Crocker M.D.   On: 02/29/2016 08:39    Sela Hilding, MD 03/03/2016, 9:47 AM PGY-1, [place] Intern pager: [phone], text pages welcome

## 2016-03-03 NOTE — Progress Notes (Signed)
Subjective: Interval History: has complaints wants to go home.  Objective: Vital signs in last 24 hours: Temp:  [98.2 F (36.8 C)-98.6 F (37 C)] 98.2 F (36.8 C) (10/01 0520) Pulse Rate:  [82-95] 95 (10/01 0520) Resp:  [17-19] 17 (10/01 0520) BP: (132-153)/(94-108) 132/108 (10/01 0520) SpO2:  [100 %] 100 % (10/01 0520) Weight change:   Intake/Output from previous day: 09/30 0701 - 10/01 0700 In: 603 [P.O.:600; I.V.:3] Out: 450 [Urine:450] Intake/Output this shift: No intake/output data recorded.  General appearance: alert, cooperative and moderately obese Resp: clear to auscultation bilaterally Cardio: S1, S2 normal and systolic murmur: holosystolic 2/6, blowing at apex GI: obese, pos bs, soft Extremities: extremities normal, atraumatic, no cyanosis or edema  Lab Results:  Recent Labs  03/02/16 0427 03/03/16 0722  WBC 13.1* 10.3  HGB 14.1 14.2  HCT 41.2 41.2  PLT 292 272   BMET:  Recent Labs  03/02/16 0427 03/03/16 0722  NA 136 135  K 3.5 3.6  CL 100* 99*  CO2 23 23  GLUCOSE 116* 141*  BUN 107* 69*  CREATININE 2.71* 2.12*  CALCIUM 9.6 9.6   No results for input(s): PTH in the last 72 hours. Iron Studies: No results for input(s): IRON, TIBC, TRANSFERRIN, FERRITIN in the last 72 hours.  Studies/Results: No results found.  I have reviewed the patient's current medications.  Assessment/Plan: 1 AKI ATN probable  Losartan and NSAIDs. Improving 2 Obesity 3 pulm infilt ??? Uremic P will s/o at this time and see again at your request.    LOS: 6 days   , L 03/03/2016,9:51 AM

## 2016-03-04 DIAGNOSIS — E872 Acidosis: Secondary | ICD-10-CM

## 2016-03-04 LAB — RENAL FUNCTION PANEL
ANION GAP: 13 (ref 5–15)
Albumin: 2.9 g/dL — ABNORMAL LOW (ref 3.5–5.0)
BUN: 49 mg/dL — AB (ref 6–20)
CALCIUM: 9.4 mg/dL (ref 8.9–10.3)
CHLORIDE: 97 mmol/L — AB (ref 101–111)
CO2: 23 mmol/L (ref 22–32)
Creatinine, Ser: 1.93 mg/dL — ABNORMAL HIGH (ref 0.61–1.24)
GFR calc Af Amer: 47 mL/min — ABNORMAL LOW (ref >60)
GFR, EST NON AFRICAN AMERICAN: 41 mL/min — AB (ref >60)
GLUCOSE: 140 mg/dL — AB (ref 65–99)
PHOSPHORUS: 3.6 mg/dL (ref 2.5–4.6)
POTASSIUM: 3.4 mmol/L — AB (ref 3.5–5.1)
Sodium: 133 mmol/L — ABNORMAL LOW (ref 135–145)

## 2016-03-04 LAB — CBC
HCT: 38.4 % — ABNORMAL LOW (ref 39.0–52.0)
Hemoglobin: 12.9 g/dL — ABNORMAL LOW (ref 13.0–17.0)
MCH: 25.4 pg — ABNORMAL LOW (ref 26.0–34.0)
MCHC: 33.6 g/dL (ref 30.0–36.0)
MCV: 75.6 fL — ABNORMAL LOW (ref 78.0–100.0)
PLATELETS: 251 10*3/uL (ref 150–400)
RBC: 5.08 MIL/uL (ref 4.22–5.81)
RDW: 16.2 % — AB (ref 11.5–15.5)
WBC: 8.8 10*3/uL (ref 4.0–10.5)

## 2016-03-04 NOTE — Progress Notes (Signed)
Name: Harold Rodriguez MRN: [id number] DOB: 16-Oct-1970    ADMISSION DATE:  02/26/2016 CONSULTATION DATE:  9/27    REFERRING MD :  FPTS  CHIEF COMPLAINT:  Abnormal CT   BRIEF PATIENT DESCRIPTION: 45yo male with hx HTN and gout initially admitted 9/25 with dyspnea, metabolic acidosis and acute renal failure with SCr 21, phos 19.8 on admission.  U/a without concern for infection, negative renal u/s.  WBC notable at 26.  He had recently been seen and treated by his PCP for a URI and was given prednisone but no antibiotics.  He was also on losartan and NSAID use.  He was started on dialysis and seen in consultation by renal who feel this is likely ANT in setting volume depletion on ARB.  Had ongoing dyspnea and CT chest revealed RUL and RML ground glass opacities and reactive hilar and mediastinal lymph nodes and PCCM was consulted to assist.   SIGNIFICANT EVENTS  9/25  Admit   STUDIES:  Renal u/s 9/26 >> Unremarkable renal ultrasound.  Fatty liver. CT chest 9/26 >> Confluent right upper and middle lobe pulmonary consolidations and ground-glass opacities with air bronchograms suggestive of an infectious etiology. Reactive mediastinal and hilar lymph nodes. UDS 9/26 >> neg   Autoimmune:  HIV >>NR HCV >>neg ANA >> NEG GBM >> NEG ANCA >> neg C3 >> 173 (H) C4 >>47 (H) Hep B surgace ag >> neg  Hep B >> neg  Hep C >> neg     SUBJECTIVE:  Pt anxious to go home.  Denies SOB, chest pain, cough, sputum production.   VITAL SIGNS: Temp:  [98.6 F (37 C)-98.8 F (37.1 C)] 98.8 F (37.1 C) (10/02 0505) Pulse Rate:  [95-96] 96 (10/02 0505) Resp:  [19] 19 (10/02 0505) BP: (130-138)/(87-95) 138/87 (10/02 0505) SpO2:  [100 %] 100 % (10/02 0505)  PHYSICAL EXAMINATION: General:  Well developed male in no distress  Neuro:  Alert and interactive, moving all ext to command, ambulating without difficulty HEENT:  [place]/AT, PERRL, EOM-I and MMM. Cardiovascular:  RRR, Nl S1/S2, -M/R/G. Lungs:   CTA bilaterally. Abdomen:  Soft, NT, ND and +BS. Musculoskeletal:  -edema and -tenderness. Skin:  Intact.   Recent Labs Lab 03/02/16 0427 03/03/16 0722 03/04/16 0515  NA 136 135 133*  K 3.5 3.6 3.4*  CL 100* 99* 97*  CO2 23 23 23   BUN 107* 69* 49*  CREATININE 2.71* 2.12* 1.93*  GLUCOSE 116* 141* 140*    Recent Labs Lab 03/02/16 0427 03/03/16 0722 03/04/16 0515  HGB 14.1 14.2 12.9*  HCT 41.2 41.2 38.4*  WBC 13.1* 10.3 8.8  PLT 292 272 251   Dg Chest Port 1 View  Result Date: 03/03/2016 CLINICAL DATA:  44 year old male with history of uremia. EXAM: PORTABLE CHEST 1 VIEW COMPARISON:  Chest x-ray 02/21/2016. FINDINGS: Previously noted right IJ catheter has been removed. Lung volumes are low. No consolidative airspace disease. No pleural effusions. No pneumothorax. No pulmonary nodule or mass noted. Pulmonary vasculature and the cardiomediastinal silhouette are within normal limits. IMPRESSION: 1. Low lung volumes without radiographic evidence of acute cardiopulmonary disease. Electronically Signed   By: Vinnie Langton M.D.   On: 03/03/2016 11:08    ASSESSMENT / PLAN:  RUL/RML ground glass opacities - unclear etiology.  DDX includes viral pneumonitis, wegener's granulomatosis, TB (much less likely), ?aspiration. Eosinophils neg so not eosinophilic PNA.  DOE and SOB - at rest O2 sat of 94% on RA Acute renal failure - thought to be  r/t volume depletion in setting ARB however, etiology remains puzzling.   PLAN: Nephrology signed off > losartan + NSAID use with AKI/ATN felt likely Quantiferon gold pending > If above negative, ok to go home.   Recommend follow up CT imaging in 4-6 weeks to ensure clearance of RUL airspace disease.  ? If he had a viral pneumonitis with preceding URI. Autoimmune labs as above No role for bronchoscopy at this time Follow up with Pulmonary arranged for discharge   PCCM will be available PRN. Please call back if new needs arise.     Noe Gens, NP-C [facility] Pgr: [phone] or if no answer [phone] 03/04/2016, 2:09 PM   STAFF NOTE: I, Merrie Roof, MD FACP have personally reviewed patient's available data, including medical history, events of note, physical examination and test results as part of my evaluation. I have discussed with resident/NP and other care providers such as pharmacist, RN and RRT. In addition, I personally evaluated patient and elicited key findings of: awake, walking, no distress, CTA, CT reviewed somewhat focal and int, not classic diffuse GG pattern, auto immune neg including vasculitis with associated ARF, await quanto, if neg can dc home and follow up pulm in office, would repeat CT in 2-4 weeks and ensure resolution of area on CT rt, may have been nonspecific viral pneumonitis, it s  Not clear to me  Lavon Paganini. Titus Mould, MD, [place] Pgr: [facility] 03/04/2016 7:35 PM

## 2016-03-04 NOTE — Discharge Instructions (Addendum)
Thank you for allowing us to participate in your care.  You were admitted for kidney failure.  We think that this was from your blood pressure medication.  Your kidney function is now improving.  You will need to follow up with the lung doctors for a suspicious spot on your lung,  This has been scheduled for you.  Please also follow up with your primary care doctor for repeat blood tests to check your kidneys in the next week.

## 2016-03-04 NOTE — Progress Notes (Signed)
Family Medicine Teaching Service Daily Progress Note Intern Pager: [phone]  Patient name: Harold Rodriguez Medical record number: [id number] Date of birth: 11/04/1970 Age: 45 y.o. Gender: male  Primary Care Provider: [facility] Consultants: nephrology Code Status: DNI  Pt Overview and Major Events to Date:  02/26/16 - presented to ED with tachypnea and compensated metabolic acidosis, Cr 22; nephro consulted, IJ cath placed, HD done 9/27 - pulm consulted 2/2 chest CT findings, quant gold sent, considering bronch  Assessment and Plan: Harold Rodriguez is a [age] y.o. male presenting with dysnpea, metabolic acidosis, and acute renal failure. PMH is significant for HTN, gout.   Acute Renal Failure: Cr 21.04 on admission.  Per Nephrology recs: Likely ATN in the settting of volume depletion on ARB, no more HD, no fluids needed, will f/u outpatient  -trend CMPs: Cr this AM 1.93.  Shortness of breath:  Resolved.  Saturating 100% on RA. CXR with scattered peribronchial nodular and ground-glass opacities, more prominent in the right lung. CT chest concerning for infectious source in RUL and RML. - Blood cx: NGTD -Pulm consulted, appreciate recs: will take a look at repeat CXR, could consider d/c today. -Quantiferon gold pending, will call ID if pulm says he can be discharged.  HTN: Hypertensive 150-160/90-100 Home meds include Norvasc and losartan/HCTZ.  -PRN hydralazine SBP>160 / DBP >110 -added norvasc 5 mg 9/29  Gout: Patient unable to give history of most recent flare. No signs of acute flair at present. Patient reports compliance with this medication. -on home Allopurinol  FEN/GI: renal diet Prophylaxis: heparin  Disposition: Home pending quant gold result  Subjective:  Pt resting comfortably, would like to go home. States he feels much better and has no symptoms. Pending quant gold.  Explained that we need to talk to to pulm and see about Tb test.  Patient understands.  Objective: Temp:  [98.6 F (37 C)-98.8 F (37.1 C)] 98.6 F (37 C) (10/02 1310) Pulse Rate:  [93-96] 93 (10/02 1310) Resp:  [19] 19 (10/02 0505) BP: (130-147)/(87-100) 147/100 (10/02 1310) SpO2:  [100 %] 100 % (10/02 0505) Physical Exam: General: Overweight male lying in bed in no acute distress Cardiovascular: rrr, no m/r/g, no edema Respiratory: CTAB, good air movement Gastrointestinal: SNTND, +BS MSK: strength 5/5 in all extremities, no pitting edema of LE Derm: no rashes on exposed skin, no wounds Neuro: CN II-XII grossly intact, sensation intact; AOx4 Psych: mood and affect appropriate  Laboratory:  Recent Labs Lab 03/02/16 0427 03/03/16 0722 03/04/16 0515  WBC 13.1* 10.3 8.8  HGB 14.1 14.2 12.9*  HCT 41.2 41.2 38.4*  PLT 292 272 251    Recent Labs Lab 02/26/16 2129  02/28/16 0516 02/29/16 0424  03/02/16 0427 03/03/16 0722 03/04/16 0515  NA 130*  < > 135 134*  < > 136 135 133*  K 4.6  < > 3.7 3.2*  < > 3.5 3.6 3.4*  CL 88*  < > 95* 96*  < > 100* 99* 97*  CO2 <7*  < > 17* 20*  < > 23 23 23   BUN 272*  < > 168* 162*  < > 107* 69* 49*  CREATININE 21.80*  < > 10.42* 6.88*  < > 2.71* 2.12* 1.93*  CALCIUM 6.3*  < > 8.2* 8.6*  < > 9.6 9.6 9.4  PROT 6.9  --  7.4 7.2  --   --   --   --   BILITOT 0.6  --  0.8 1.0  --   --   --   --  ALKPHOS 64  --  71 66  --   --   --   --   ALT 103*  --  81* 65*  --   --   --   --   AST 37  --  30 30  --   --   --   --   GLUCOSE 104*  < > 173* 113*  < > 116* 141* 140*  < > = values in this interval not displayed. CK 5,400 Urine protein/Cr ratio 0.26 UA - squam 0-5; few bacteria, hyaline casts; cloudy, moderate hemoglobin, + protein  Imaging/Diagnostic Tests: Dg Chest Port 1 View  Result Date: 03/03/2016 CLINICAL DATA:  45 year old male with history of uremia. EXAM: PORTABLE CHEST 1 VIEW COMPARISON:  Chest x-ray 02/21/2016. FINDINGS: Previously noted right IJ catheter has been removed. Lung volumes are  low. No consolidative airspace disease. No pleural effusions. No pneumothorax. No pulmonary nodule or mass noted. Pulmonary vasculature and the cardiomediastinal silhouette are within normal limits. IMPRESSION: 1. Low lung volumes without radiographic evidence of acute cardiopulmonary disease. Electronically Signed   By: Trudie Reed M.D.   On: 03/03/2016 11:08    Freddrick March, MD 03/04/2016, 2:21 PM PGY-1, [facility] FPTS Intern pager: [phone], text pages welcome

## 2016-03-06 LAB — QUANTIFERON IN TUBE
QFT TB AG MINUS NIL VALUE: <0 IU/mL
QUANTIFERON MITOGEN VALUE: 0.62 [IU]/mL
QUANTIFERON TB AG VALUE: 0.03 IU/mL
QUANTIFERON TB GOLD: NEGATIVE
Quantiferon Nil Value: 0.05 IU/mL

## 2016-03-06 LAB — QUANTIFERON TB GOLD ASSAY (BLOOD)

## 2016-03-14 ENCOUNTER — Encounter: Payer: Self-pay | Admitting: Family Medicine

## 2016-03-14 ENCOUNTER — Ambulatory Visit (INDEPENDENT_AMBULATORY_CARE_PROVIDER_SITE_OTHER): Payer: BLUE CROSS/BLUE SHIELD | Admitting: Family Medicine

## 2016-03-14 VITALS — BP 122/89 | HR 90 | Temp 98.0°F | Ht 74.0 in | Wt 246.2 lb

## 2016-03-14 DIAGNOSIS — Z09 Encounter for follow-up examination after completed treatment for conditions other than malignant neoplasm: Secondary | ICD-10-CM

## 2016-03-14 LAB — RENAL FUNCTION PANEL
Albumin: 3.6 g/dL (ref 3.6–5.1)
BUN: 14 mg/dL (ref 7–25)
CALCIUM: 9.1 mg/dL (ref 8.6–10.3)
CO2: 27 mmol/L (ref 20–31)
Chloride: 104 mmol/L (ref 98–110)
Creat: 1.45 mg/dL — ABNORMAL HIGH (ref 0.60–1.35)
GLUCOSE: 83 mg/dL (ref 65–99)
POTASSIUM: 4.4 mmol/L (ref 3.5–5.3)
Phosphorus: 3.2 mg/dL (ref 2.5–4.5)
Sodium: 141 mmol/L (ref 135–146)

## 2016-03-14 NOTE — Assessment & Plan Note (Signed)
Patient with creatinine of 1.93 on discharge day. Patient received 2 HD sessions for uremia. --Order renal function panel --Refer to Nephrology for follow up with last hospital admission requiring HD and extensive family history of kidney disease.

## 2016-03-14 NOTE — Patient Instructions (Addendum)
It was great seeing you today! We have addressed the following issues today  1. We discussed your kidney function, and will do repeat lab to assess your kidney function since discharge 2. You will follow up with pulmonary on November 6 at 9:15 am 3. I will refer you to a nephrologist and they will call you to schedule and appointment   If we did any lab work today, and the results require attention, either me or my nurse will get in touch with you. If everything is normal, you will get a letter in mail. If you don't hear from us in two weeks, please give us a call. Otherwise, I look forward to talking with you again at our next visit. If you have any questions or concerns before then, please call the clinic at [phone].  Please bring all your medications to every doctors visit   Sign up for My Chart to have easy access to your labs results, and communication with your Primary care physician.    Please check-out at the front desk before leaving the clinic.   Take Care,

## 2016-03-14 NOTE — Assessment & Plan Note (Signed)
Recent findings on CT scan on 9/26 concerning for infectious process i.e. TB. Quantiferon gold negative. Patient will need follow up with pulmonology, with repeat CT. Patient had a normal lung exam, denies any B symptoms since discharge. --Patient will follow up with pulmonary on 11/6 --Patient will schedule a visit in clinic to establish care and further work up as needed.

## 2016-03-14 NOTE — Progress Notes (Signed)
  HPI:  Harold Rodriguez presents for hospital follow up. Patient was hospitalized from 02/26/2016 to 03/04/2016 with Acute Renal Failure.  Patient reports that he has been doing well since discharge with no complaints. Patient denies shortness of breath, nausea or vomiting as he initially presented. Patient blood pressure has been better controlled. He denies headaches, blurry vision, dizziness, tinnitus or chest pain.  ROS: See HPI.  PMFSH: Active Ambulatory Problems    Diagnosis Date Noted  . Acute kidney failure (HCC) 02/26/2016  . Dyspnea   . Acute renal failure (HCC)   . Metabolic acidosis   . Opacity of lung on imaging study   . Presence of permanent central venous catheter   . Uremia    Resolved Ambulatory Problems    Diagnosis Date Noted  . No Resolved Ambulatory Problems   Past Medical History:  Diagnosis Date  . Gout   . Hypertension     PHYSICAL EXAM: BP 122/89   Pulse 90   Temp 98 F (36.7 C) (Oral)   Ht 6\' 2"  (1.88 m)   Wt 246 lb 3.2 oz (111.7 kg)   BMI 31.61 kg/m  Gen: Pleasant gentleman, in no acute distress, cooperative and able to participate in exam. HEENT: Atraumatic, normocephalic, moist mucous membranes, neck supple, no lymphadenopathy Heart: Normal S1, S2. Regular rate and rhythm. no rubs, mumur or gallop appreciated on exam Lungs: clear to auscultation bilaterally, no wheezes, crackles, or rhonchi Neuro: AOx3 all Cranial nerves  II-XII intact Ext: Full range of motion in all extremities  ASSESSMENT/PLAN:  Acute renal failure (HCC) Patient with creatinine of 1.93 on discharge day. Patient received 2 HD sessions for uremia. --Order renal function panel --Refer to Nephrology for follow up with last hospital admission requiring HD and extensive family history of kidney disease.   Opacity of lung on imaging study Recent findings on CT scan on 9/26 concerning for infectious process i.e. TB. Quantiferon gold negative. Patient will need follow up  with pulmonology, with repeat CT. Patient had a normal lung exam, denies any B symptoms since discharge. --Patient will follow up with pulmonary on 11/6 --Patient will schedule a visit in clinic to establish care and further work up as needed.  FOLLOW UP: Follow up in November after pulmonary appointment for new patient visit. Patient is stablishing care with clinic.  GrenadaBrittany J. Pollie MeyerMcIntyre, MD [facility]

## 2016-04-08 ENCOUNTER — Encounter: Payer: Self-pay | Admitting: Pulmonary Disease

## 2016-04-08 ENCOUNTER — Ambulatory Visit (INDEPENDENT_AMBULATORY_CARE_PROVIDER_SITE_OTHER): Payer: BLUE CROSS/BLUE SHIELD | Admitting: Pulmonary Disease

## 2016-04-08 VITALS — BP 130/80 | HR 67 | Ht 72.0 in | Wt 255.6 lb

## 2016-04-08 DIAGNOSIS — J849 Interstitial pulmonary disease, unspecified: Secondary | ICD-10-CM

## 2016-04-08 NOTE — Patient Instructions (Signed)
We'll schedule you for a CT of the chest to make sure that the opacities have resolved. I will give a call to discuss the results and plan for further workup as needed. Return to clinic in 1 month.

## 2016-04-08 NOTE — Progress Notes (Signed)
Dshaun Reppucci    [id number]    1971/02/16  Primary Care Physician:Abdoulaye Diallo, MD  Referring Physician: [place] [address] [place], [place] [address]  Chief complaint:   Follow-up after recent hospitalization. Abnormal CT scan of the chest.  HPI: 45yo male with hx HTN and gout initially admitted 9/25 with dyspnea, metabolic acidosis and acute renal failure with SCr 21, phos 19.8 on admission.  U/a without concern for infection, negative renal u/s.  WBC notable at 26.  He had recently been seen and treated by his PCP for a URI and was given prednisone but no antibiotics.  He was also on losartan and NSAID use.  He was started on dialysis and seen in consultation by renal who feel this is likely AKI in setting volume depletion on ARB.  Had ongoing dyspnea and CT chest revealed RUL and RML ground glass opacities and reactive hilar and mediastinal lymph nodes and PCCM was consulted to assist.   Since his discharge his renal function continues to improve. He's recently seen his nephrologist and has been off hemodialysis. He denies any symptoms related to his lungs. Dyspnea is improved. Denies any cough, sputum production, fevers, chills.  Outpatient Encounter Prescriptions as of 04/08/2016  Medication Sig  . allopurinol (ZYLOPRIM) 100 MG tablet Take 100 mg by mouth 2 (two) times daily.  Marland Kitchen amLODipine (NORVASC) 5 MG tablet Take 5 mg by mouth daily.  Marland Kitchen ibuprofen (ADVIL,MOTRIN) 200 MG tablet Take 600 mg by mouth every 6 (six) hours as needed (pain).   No facility-administered encounter medications on file as of 04/08/2016.     Allergies as of 04/08/2016  . (No Known Allergies)    Past Medical History:  Diagnosis Date  . Gout   . Hypertension     History reviewed. No pertinent surgical history.  History reviewed. No pertinent family history.  Social History   Social History  . Marital status: Single    Spouse name: N/A    . Number of children: N/A  . Years of education: N/A   Occupational History  . Not on file.   Social History Main Topics  . Smoking status: Never Smoker  . Smokeless tobacco: Never Used  . Alcohol use Yes     Comment: occ  . Drug use: No  . Sexual activity: Not on file   Other Topics Concern  . Not on file   Social History Narrative  . No narrative on file     Review of systems: Review of Systems  Constitutional: Negative for fever and chills.  HENT: Negative.   Eyes: Negative for blurred vision.  Respiratory: as per HPI  Cardiovascular: Negative for chest pain and palpitations.  Gastrointestinal: Negative for vomiting, diarrhea, blood per rectum. Genitourinary: Negative for dysuria, urgency, frequency and hematuria.  Musculoskeletal: Negative for myalgias, back pain and joint pain.  Skin: Negative for itching and rash.  Neurological: Negative for dizziness, tremors, focal weakness, seizures and loss of consciousness.  Endo/Heme/Allergies: Negative for environmental allergies.  Psychiatric/Behavioral: Negative for depression, suicidal ideas and hallucinations.  All other systems reviewed and are negative.   Physical Exam: Blood pressure 130/80, pulse 67, height 6' (1.829 m), weight 255 lb 9.6 oz (115.9 kg), SpO2 100 %. Gen:      No acute distress HEENT:  EOMI, sclera anicteric Neck:     No masses; no thyromegaly Lungs:    Clear to auscultation bilaterally; normal respiratory  effort CV:         Regular rate and rhythm; no murmurs Abd:      + bo./5wel sounds; soft, non-tender; no palpable masses, no distension Ext:    No edema; adequate peripheral perfusion Skin:      Warm and dry; no rash Neuro: alert and oriented x 3 Psych: normal mood and affect  Data Reviewed: Renal u/s 9/26 >> Unremarkable renal ultrasound.  Fatty liver. CT chest 9/26 >> Confluent right upper and middle lobe pulmonary consolidations and ground-glass opacities with air bronchograms  suggestive of an infectious etiology. Reactive mediastinal and hilar lymph nodes. Images reviewed.  UDS 9/26 >> neg   Autoimmune:  HIV >>NR HCV >>neg ANA >> NEG GBM >> NEG ANCA >> neg C3 >> 173 (H) C4 >>47 (H) Hep B surgace ag >> neg  Hep B >> neg  Hep C >> neg  Quantiferon negative.    Assessment:  RUL/RML ground glass opacities - unclear etiology.  DDX includes viral pneumonitis ILD possible but serologies are negative. I will evaluate with High res CT of the chest. If the abnormalities persist then he will need a bronchoscopy with BAL, biopsies.   Acute renal failure - thought to be due to volume depletion in setting ARB. Off dialysis now.  Plan/Recommendations: - High res CT of chest.   Marshell Garfinkel MD [facility] Pager [phone] 04/08/2016, 9:26 AM  CC: Sharmaine Base, [place]*

## 2016-04-17 ENCOUNTER — Ambulatory Visit (INDEPENDENT_AMBULATORY_CARE_PROVIDER_SITE_OTHER)
Admission: RE | Admit: 2016-04-17 | Discharge: 2016-04-17 | Disposition: A | Discharge status: Home / Self Care | Payer: BLUE CROSS/BLUE SHIELD | Source: Ambulatory Visit | Attending: Pulmonary Disease | Admitting: Pulmonary Disease

## 2016-04-17 DIAGNOSIS — J849 Interstitial pulmonary disease, unspecified: Secondary | ICD-10-CM

## 2016-04-19 NOTE — Progress Notes (Signed)
Noted that pt was made aware of results by another practice.  Did attempt to contact pt as well but no answer and VM is not set up yet.

## 2016-05-08 ENCOUNTER — Encounter: Payer: Self-pay | Admitting: Family Medicine

## 2016-05-09 ENCOUNTER — Other Ambulatory Visit: Payer: Self-pay | Admitting: *Deleted

## 2016-05-09 MED ORDER — AMLODIPINE BESYLATE 5 MG PO TABS: 5.0000 mg | ORAL_TABLET | Freq: Every day | ORAL | 1 refills | Status: DC

## 2016-05-09 MED ORDER — IBUPROFEN 200 MG PO TABS: 600.0000 mg | ORAL_TABLET | Freq: Four times a day (QID) | ORAL | 1 refills | Status: DC | PRN

## 2016-05-09 MED ORDER — ALLOPURINOL 100 MG PO TABS: 100.0000 mg | ORAL_TABLET | Freq: Two times a day (BID) | ORAL | 1 refills | Status: DC

## 2016-05-13 ENCOUNTER — Ambulatory Visit (INDEPENDENT_AMBULATORY_CARE_PROVIDER_SITE_OTHER): Payer: BLUE CROSS/BLUE SHIELD | Admitting: Pulmonary Disease

## 2016-05-13 VITALS — BP 156/90 | HR 84 | Ht 72.0 in | Wt 275.8 lb

## 2016-05-13 DIAGNOSIS — R918 Other nonspecific abnormal finding of lung field: Secondary | ICD-10-CM

## 2016-05-13 NOTE — Progress Notes (Signed)
Harold Rodriguez    [id number]    1971-01-29  Primary Care Physician:Abdoulaye Diallo, MD  Referring Physician: Marjie Skiff, MD [address], [place] [address]  Chief complaint:   Follow-up after recent hospitalization. Abnormal CT scan of the chest.  HPI: 45yo male with hx HTN and gout initially admitted 9/25 with dyspnea, metabolic acidosis and acute renal failure with SCr 21, phos 19.8 on admission.  U/a without concern for infection, negative renal u/s.  WBC notable at 26.  He had recently been seen and treated by his PCP for a URI and was given prednisone but no antibiotics.  He was also on losartan and NSAID use.  He was started on dialysis and seen in consultation by renal who feel this is likely AKI in setting volume depletion on ARB.  Had ongoing dyspnea and CT chest revealed RUL and RML ground glass opacities and reacti `ve hilar and mediastinal lymph nodes and PCCM was consulted to assist.   Since his discharge his renal function continues to improve. He's recently seen his nephrologist and has been off hemodialysis. He denies any symptoms related to his lungs. Dyspnea is improved. Denies any cough, sputum production, fevers, chills.  Outpatient Encounter Prescriptions as of 05/13/2016  Medication Sig  . allopurinol (ZYLOPRIM) 100 MG tablet Take 1 tablet (100 mg total) by mouth 2 (two) times daily.  Marland Kitchen amLODipine (NORVASC) 5 MG tablet Take 1 tablet (5 mg total) by mouth daily.  Marland Kitchen ibuprofen (ADVIL,MOTRIN) 200 MG tablet Take 3 tablets (600 mg total) by mouth every 6 (six) hours as needed (pain).   No facility-administered encounter medications on file as of 05/13/2016.     Allergies as of 05/13/2016  . (No Known Allergies)    Past Medical History:  Diagnosis Date  . Gout   . Hypertension     No past surgical history on file.  No family history on file.  Social History   Social History  . Marital status: Single    Spouse name: N/A  .  Number of children: N/A  . Years of education: N/A   Occupational History  . Not on file.   Social History Main Topics  . Smoking status: Never Smoker  . Smokeless tobacco: Never Used  . Alcohol use Yes     Comment: occ  . Drug use: No  . Sexual activity: Not on file   Other Topics Concern  . Not on file   Social History Narrative  . No narrative on file   Review of systems: Review of Systems  Constitutional: Negative for fever and chills.  HENT: Negative.   Eyes: Negative for blurred vision.  Respiratory: as per HPI  Cardiovascular: Negative for chest pain and palpitations.  Gastrointestinal: Negative for vomiting, diarrhea, blood per rectum. Genitourinary: Negative for dysuria, urgency, frequency and hematuria.  Musculoskeletal: Negative for myalgias, back pain and joint pain.  Skin: Negative for itching and rash.  Neurological: Negative for dizziness, tremors, focal weakness, seizures and loss of consciousness.  Endo/Heme/Allergies: Negative for environmental allergies.  Psychiatric/Behavioral: Negative for depression, suicidal ideas and hallucinations.  All other systems reviewed and are negative.   Physical Exam: Blood pressure 130/80, pulse 67, height 6' (1.829 m), weight 255 lb 9.6 oz (115.9 kg), SpO2 100 %. Gen:      No acute distress HEENT:  EOMI, sclera anicteric Neck:     No masses; no thyromegaly Lungs:    Clear to auscultation  bilaterally; normal respiratory effort CV:         Regular rate and rhythm; no murmurs Abd:      + bo./5wel sounds; soft, non-tender; no palpable masses, no distension Ext:    No edema; adequate peripheral perfusion Skin:      Warm and dry; no rash Neuro: alert and oriented x 3 Psych: normal mood and affect  Data Reviewed: Renal u/s 9/26 >> Unremarkable renal ultrasound.  Fatty liver. CT chest 9/26 >> Confluent right upper and middle lobe pulmonary consolidations and ground-glass opacities with air bronchograms suggestive of  an infectious etiology. Reactive mediastinal and hilar lymph nodes. Images reviewed.  UDS 9/26 >> neg  CT scan High-resolution 04/17/16- no evidence of interstitial lung disease, near-complete resolution of right upper lobe and right middle lobe consolidation, opacities and the lymphadenopathy All images reviewed  Autoimmune:  HIV >>NR HCV >>neg ANA >> NEG GBM >> NEG ANCA >> neg C3 >> 173 (H) C4 >>47 (H) Hep B surgace ag >> neg  Hep B >> neg  Hep C >> neg  Quantiferon negative.    Assessment:  RUL/RML ground glass opacities with lymphadenopathy Likely from an infectious etiology during recent hospitalization. His repeat CT scan reviewed and it shows complete resolution of these abnormalities. There is no evidence of interstitial lung disease and autoimmune serologies are negative.  There is evidence of mild small airway disease on his high-resolution CT as shown by air trapping. We can consider pulmonary function tests but he continues to be asymptomatic.   Plan/Recommendations: - Follow up in 6 months  Marshell Garfinkel MD [facility] Pager [phone] 05/13/2016, 3:48 PM  CC: Marjie Skiff, MD

## 2016-05-30 ENCOUNTER — Encounter: Payer: Self-pay | Admitting: Pulmonary Disease

## 2016-06-08 ENCOUNTER — Other Ambulatory Visit: Payer: Self-pay | Admitting: Family Medicine

## 2016-06-30 ENCOUNTER — Other Ambulatory Visit: Payer: Self-pay | Admitting: Family Medicine

## 2016-07-24 ENCOUNTER — Encounter: Payer: Self-pay | Admitting: Family Medicine

## 2016-07-25 ENCOUNTER — Other Ambulatory Visit: Payer: Self-pay | Admitting: Family Medicine

## 2016-08-07 ENCOUNTER — Other Ambulatory Visit: Payer: Self-pay | Admitting: Family Medicine

## 2016-09-04 ENCOUNTER — Other Ambulatory Visit: Payer: Self-pay | Admitting: Family Medicine

## 2016-10-17 ENCOUNTER — Other Ambulatory Visit: Payer: Self-pay | Admitting: Family Medicine

## 2016-11-07 ENCOUNTER — Other Ambulatory Visit: Payer: Self-pay | Admitting: Family Medicine

## 2016-11-23 ENCOUNTER — Other Ambulatory Visit: Payer: Self-pay | Admitting: Family Medicine

## 2016-12-04 ENCOUNTER — Other Ambulatory Visit: Payer: Self-pay | Admitting: Family Medicine

## 2017-01-26 ENCOUNTER — Other Ambulatory Visit: Payer: Self-pay | Admitting: Family Medicine

## 2017-02-15 ENCOUNTER — Other Ambulatory Visit: Payer: Self-pay | Admitting: Family Medicine

## 2017-05-09 ENCOUNTER — Other Ambulatory Visit: Payer: Self-pay | Admitting: Family Medicine

## 2017-07-09 ENCOUNTER — Other Ambulatory Visit: Payer: Self-pay | Admitting: Family Medicine

## 2017-07-10 ENCOUNTER — Encounter: Payer: Self-pay | Admitting: Family Medicine

## 2017-07-10 ENCOUNTER — Other Ambulatory Visit: Payer: Self-pay

## 2017-07-10 ENCOUNTER — Ambulatory Visit: Payer: BLUE CROSS/BLUE SHIELD | Admitting: Family Medicine

## 2017-07-10 ENCOUNTER — Ambulatory Visit (INDEPENDENT_AMBULATORY_CARE_PROVIDER_SITE_OTHER): Payer: 59 | Admitting: Family Medicine

## 2017-07-10 VITALS — BP 142/81 | HR 93 | Temp 98.6°F | Wt 267.0 lb

## 2017-07-10 DIAGNOSIS — Z13220 Encounter for screening for lipoid disorders: Secondary | ICD-10-CM

## 2017-07-10 DIAGNOSIS — M1A9XX1 Chronic gout, unspecified, with tophus (tophi): Secondary | ICD-10-CM

## 2017-07-10 DIAGNOSIS — Z23 Encounter for immunization: Secondary | ICD-10-CM

## 2017-07-10 DIAGNOSIS — I10 Essential (primary) hypertension: Secondary | ICD-10-CM

## 2017-07-10 DIAGNOSIS — Z131 Encounter for screening for diabetes mellitus: Secondary | ICD-10-CM | POA: Diagnosis not present

## 2017-07-10 MED ORDER — AMLODIPINE BESYLATE 10 MG PO TABS: 10.0000 mg | ORAL_TABLET | Freq: Every day | ORAL | 0 refills | Status: DC

## 2017-07-10 MED ORDER — ALLOPURINOL 100 MG PO TABS: 100.0000 mg | ORAL_TABLET | Freq: Every day | ORAL | 0 refills | Status: DC

## 2017-07-10 NOTE — Patient Instructions (Signed)
It was great seeing you today! We have addressed the following issues today  1. I will check kidney and liver function as well as your cholesterol and screen for diabetes. 2. Take one pill of your blood pressure 10 mg daily.  3. Take one pill of your gout medication a day. 4. I will see you in a month to discuss the results.  If we did any lab work today, and the results require attention, either me or my nurse will get in touch with you. If everything is normal, you will get a letter in mail and a message via . If you don't hear from us in two weeks, please give us a call. Otherwise, we look forward to seeing you again at your next visit. If you have any questions or concerns before then, please call the clinic at [phone].  Please bring all your medications to every doctors visit  Sign up for My Chart to have easy access to your labs results, and communication with your Primary care physician. Please ask Front Desk for some assistance.   Please check-out at the front desk before leaving the clinic.    Take Care,   Dr. Sydnee Cabaliallo

## 2017-07-10 NOTE — Progress Notes (Signed)
   Subjective:    Patient ID: Harold Rodriguez, male    DOB: [birth year], 47 y.o.   MRN: [id number]   CC: Follow-up for hypertension and gout  HPI: Patient is a 47 year old male with a past medical history significant for hypertension, CKD 3, gout who presents today to follow-up on current medical issues.  Patient reports that he has been taking his blood pressure and prophylactic gout prescribed medication as prescribed.  Patient denies any acute problems. Patient reports weight gain since last office visit but is otherwise feeling well.  Patient would like to have blood work done today.  No recent gout flare the patient although patient has a significant history of it. Patient denies any chest pain, shortness of breath, headache, vision change, nausea, vomiting, fever or chills.  Smoking status reviewed   ROS: all other systems were reviewed and are negative other than in the HPI   Past Medical History:  Diagnosis Date  . Gout   . Hypertension     No past surgical history on file.  Past medical history, surgical, family, and social history reviewed and updated in the EMR as appropriate.  Objective:  BP (!) 142/81   Pulse 93   Temp 98.6 F (37 C) (Oral)   Wt 267 lb (121.1 kg)   SpO2 98%   BMI 36.21 kg/m   Vitals and nursing note reviewed  General: Obese man, in no acute distress, pleasant, able to participate in exam Cardiac: RRR, normal heart sounds, no murmurs. 2+ radial and PT pulses bilaterally Respiratory: CTAB, normal effort, No wheezes, rales or rhonchi Abdomen: soft, nontender, nondistended, no hepatic or splenomegaly, +BS Extremities: no edema or cyanosis. WWP. Skin: warm and dry, no rashes noted Neuro: alert and oriented x4, no focal deficits Psych: Normal affect and mood   Assessment & Plan:   #Hypertension, uncontrolled Today blood pressure is 142/81.  Patient currently on amlodipine 5 mg daily.  Denies any headache vision change, tinnitus, dizziness,  chest pain, shortness of breath.  Given history of CKD 3, patient will need data blood pressure control.  Will increase Norvasc to 10 mg daily.  Will repeat CMP to reassess kidney function.  Will consider switching to our based on history of gout and CKD. --Increase amlodipine from 5 mg to 10 mg daily --Order CMP, CBC --Patient will follow-up in 1 month  #Gout Patient with significant history of gout currently on allopurinol 100 mg twice daily.  Patient has serum creatinine was 1.45 with normal BUN.  Given history of CKD 3 with a creatinine of 2.1 in the past will adjust allopurinol prophylactic dose.  Recommend 100 mg daily.  Will readjust as needed after results from CMP and uric acid.  Advised patient on making dietary changes necessary control of gout flare ups. --Order uric acid level --Decrease allopurinol 100 mg daily  #Obesity Patient has over 20 pounds in the past and a half.  Patient diet blame current and lack of physical activity/exercise.  Discussed making significant changes to current lifestyle noted to promote weight loss.  Patient is in agreement with current plan and will attempt to follow it.  We will continue to monitor.  #Diabetes screening --Order A1c  #Hyperlipidemia screening --Order lipid panel   Health maintenance, patient received flu shot today.  Lovena NeighboursAbdoulaye , MD [facility] PGY-2

## 2017-07-11 LAB — CBC WITH DIFFERENTIAL/PLATELET
BASOS ABS: 0 10*3/uL (ref 0.0–0.2)
Basos: 0 %
EOS (ABSOLUTE): 0.1 10*3/uL (ref 0.0–0.4)
EOS: 1 %
HEMATOCRIT: 45.1 % (ref 37.5–51.0)
Hemoglobin: 15.2 g/dL (ref 13.0–17.7)
Immature Grans (Abs): 0 10*3/uL (ref 0.0–0.1)
Immature Granulocytes: 0 %
LYMPHS ABS: 2.3 10*3/uL (ref 0.7–3.1)
Lymphs: 26 %
MCH: 26.2 pg — AB (ref 26.6–33.0)
MCHC: 33.7 g/dL (ref 31.5–35.7)
MCV: 78 fL — ABNORMAL LOW (ref 79–97)
MONOS ABS: 0.6 10*3/uL (ref 0.1–0.9)
Monocytes: 7 %
Neutrophils Absolute: 5.8 10*3/uL (ref 1.4–7.0)
Neutrophils: 66 %
Platelets: 269 10*3/uL (ref 150–379)
RBC: 5.81 x10E6/uL — AB (ref 4.14–5.80)
RDW: 18.3 % — AB (ref 12.3–15.4)
WBC: 8.8 10*3/uL (ref 3.4–10.8)

## 2017-07-11 LAB — CMP14+EGFR
ALBUMIN: 4.7 g/dL (ref 3.5–5.5)
ALT: 47 IU/L — AB (ref 0–44)
AST: 36 IU/L (ref 0–40)
Albumin/Globulin Ratio: 1.6 (ref 1.2–2.2)
Alkaline Phosphatase: 52 IU/L (ref 39–117)
BUN / CREAT RATIO: 10 (ref 9–20)
BUN: 12 mg/dL (ref 6–24)
Bilirubin Total: 0.3 mg/dL (ref 0.0–1.2)
CO2: 22 mmol/L (ref 20–29)
Calcium: 10.1 mg/dL (ref 8.7–10.2)
Chloride: 100 mmol/L (ref 96–106)
Creatinine, Ser: 1.25 mg/dL (ref 0.76–1.27)
GFR, EST AFRICAN AMERICAN: 80 mL/min/{1.73_m2} (ref >59)
GFR, EST NON AFRICAN AMERICAN: 69 mL/min/{1.73_m2} (ref >59)
Globulin, Total: 3 g/dL (ref 1.5–4.5)
Glucose: 88 mg/dL (ref 65–99)
Potassium: 4.6 mmol/L (ref 3.5–5.2)
Sodium: 142 mmol/L (ref 134–144)
Total Protein: 7.7 g/dL (ref 6.0–8.5)

## 2017-07-11 LAB — LIPID PANEL
Chol/HDL Ratio: 2.9 ratio (ref 0.0–5.0)
Cholesterol, Total: 221 mg/dL — ABNORMAL HIGH (ref 100–199)
HDL: 77 mg/dL (ref >39)
LDL CALC: 95 mg/dL (ref 0–99)
TRIGLYCERIDES: 247 mg/dL — AB (ref 0–149)
VLDL CHOLESTEROL CAL: 49 mg/dL — AB (ref 5–40)

## 2017-07-11 LAB — HEMOGLOBIN A1C
Est. average glucose Bld gHb Est-mCnc: 120 mg/dL
Hgb A1c MFr Bld: 5.8 % — ABNORMAL HIGH (ref 4.8–5.6)

## 2017-07-11 LAB — URIC ACID: Uric Acid: 7.7 mg/dL (ref 3.7–8.6)

## 2017-07-24 ENCOUNTER — Encounter: Payer: Self-pay | Admitting: Family Medicine

## 2017-07-31 ENCOUNTER — Other Ambulatory Visit: Payer: Self-pay | Admitting: Family Medicine

## 2017-08-08 ENCOUNTER — Other Ambulatory Visit: Payer: Self-pay | Admitting: Family Medicine

## 2017-08-08 DIAGNOSIS — I10 Essential (primary) hypertension: Secondary | ICD-10-CM

## 2017-08-11 ENCOUNTER — Other Ambulatory Visit: Payer: Self-pay

## 2017-08-11 ENCOUNTER — Encounter: Payer: Self-pay | Admitting: Family Medicine

## 2017-08-11 ENCOUNTER — Ambulatory Visit (INDEPENDENT_AMBULATORY_CARE_PROVIDER_SITE_OTHER): Payer: Managed Care, Other (non HMO) | Admitting: Family Medicine

## 2017-08-11 VITALS — BP 122/74 | HR 73 | Temp 98.1°F | Wt 267.0 lb

## 2017-08-11 DIAGNOSIS — E785 Hyperlipidemia, unspecified: Secondary | ICD-10-CM

## 2017-08-11 DIAGNOSIS — I10 Essential (primary) hypertension: Secondary | ICD-10-CM

## 2017-08-11 DIAGNOSIS — R7303 Prediabetes: Secondary | ICD-10-CM | POA: Insufficient documentation

## 2017-08-11 DIAGNOSIS — Z6836 Body mass index (BMI) 36.0-36.9, adult: Secondary | ICD-10-CM | POA: Diagnosis not present

## 2017-08-11 NOTE — Patient Instructions (Signed)
It was great seeing you today! We have addressed the following issues today  1. Your  Blood work was unremarkable except for un slightly elevated cholesterol and your A1c of 5.8 making you Pre-diabetic. Please work on exercise and diet as discuss. I will see you in three months to reassess your issues. 2. Your Blood pressure is good today keep taking the medication as discussed.  If we did any lab work today, and the results require attention, either me or my nurse will get in touch with you. If everything is normal, you will get a letter in mail and a message via . If you don't hear from us in two weeks, please give us a call. Otherwise, we look forward to seeing you again at your next visit. If you have any questions or concerns before then, please call the clinic at [phone].  Please bring all your medications to every doctors visit  Sign up for My Chart to have easy access to your labs results, and communication with your Primary care physician. Please ask Front Desk for some assistance.   Please check-out at the front desk before leaving the clinic.    Take Care,   Dr. Sydnee Cabaliallo

## 2017-08-11 NOTE — Assessment & Plan Note (Signed)
Blood pressure today is within normal range.  Patient's amlodipine was recently increased from 5 to10 mg daily.  Patient has tolerated transition well no issues such as lower extremity edema.  We will continue current regimen.

## 2017-08-11 NOTE — Progress Notes (Signed)
   Subjective:    Patient ID: Harold Rodriguez, male    DOB: [birth year], 47 y.o.   MRN: [id number]   CC: Blood work follow up   HPI: Patient is a 47 yo male with a past medical history significant for HTN and gout who is here to follow up recent lab work. Patient was started on amlodipine 10 mg daily at last office visit.  Patient reports no issues.  Patient is here today to discuss cholesterol panel findings as well as A1c.  No acute complaints.  Smoking status reviewed   ROS: all other systems were reviewed and are negative other than in the HPI   Past Medical History:  Diagnosis Date  . Gout   . Hypertension     No past surgical history on file.  Past medical history, surgical, family, and social history reviewed and updated in the EMR as appropriate.  Objective:  BP 122/74   Pulse 73   Temp 98.1 F (36.7 C) (Oral)   Wt 267 lb (121.1 kg)   SpO2 98%   BMI 36.21 kg/m   Vitals and nursing note reviewed  General: NAD, pleasant, able to participate in exam Cardiac: RRR, normal heart sounds, no murmurs. 2+ radial and PT pulses bilaterally Respiratory: CTAB, normal effort, No wheezes, rales or rhonchi Abdomen: soft, nontender, nondistended, no hepatic or splenomegaly, +BS Extremities: no edema or cyanosis. WWP. Skin: warm and dry, no rashes noted Neuro: alert and oriented x4, no focal deficits Psych: Normal affect and mood   Assessment & Plan:   #Hyperlipidemia Lipid panel showed a Total cholesterol 221, HDL 77, LDL 95 and triglyceride 247.  Patient only chronic problem is hypertension.  ASCVD is 5.6%.  Given patient age and comorbidities discussed option to start on low-dose statin will work on therapeutic lifestyle changes.  We have opted on dietary changes and exercise if you recheck in a few months.  We will continue to monitor.  #Prediabetes Patient is A1c was 5.8.  Patient has a strong family history of diabetes.  Discussed therapeutic lifestyle changes needed to  avoid diabetes diagnosis.  Patient is willing to make necessary changes.  Will recheck A1c in 3 months.  For the time being we will not start patient on oral agent.    Lovena NeighboursAbdoulaye , MD [facility] PGY-2

## 2017-09-04 IMAGING — CT CT CHEST HIGH RESOLUTION W/O CM
2 of 5 series · 15 of 36 positions shown, 18 images · non-contrast
Comparison: Chest CT 02/27/2016.

CLINICAL DATA: 44-year-old male with recent hospitalization for
renal failure at which point some areas of ground-glass attenuation
were discovered in the right lung. Followup study to ensure
resolution.

EXAM:
CT CHEST WITHOUT CONTRAST
TECHNIQUE: Multidetector CT imaging of the chest was performed following the
standard protocol without intravenous contrast. High resolution
imaging of the lungs, as well as inspiratory and expiratory imaging,
was performed.

[Series 4: high resolution · axial · 0.74mm/px · z∈[-302,-34]mm · 12 of 148 slices shown, 15 images]
[im 7/148  mediastinal]
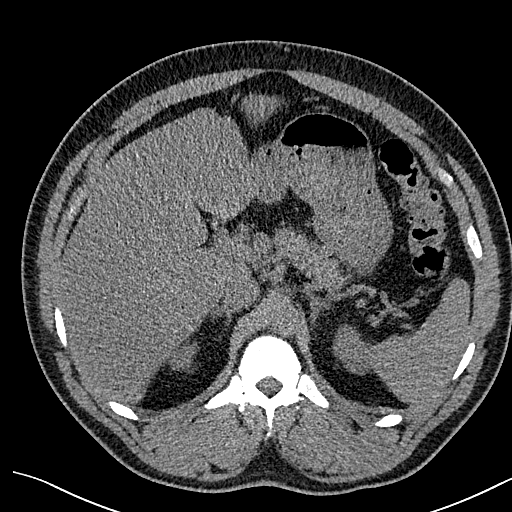
[im 7/148  lung]
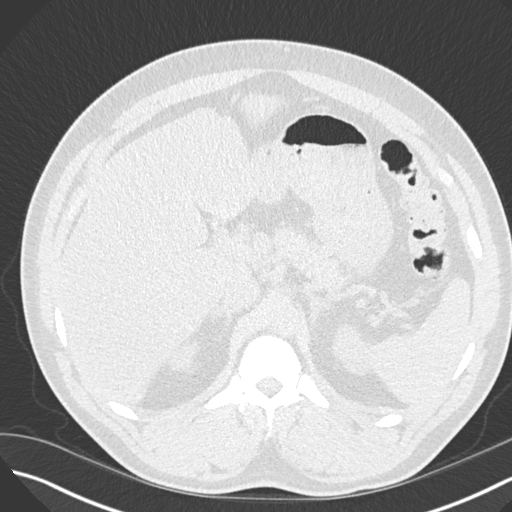
[im 21/148  lung]
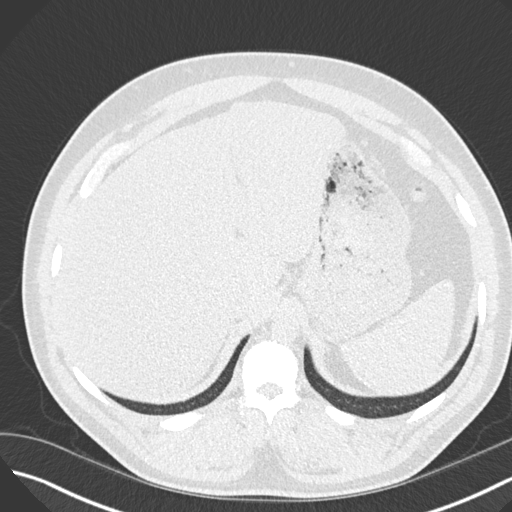
[im 34/148  lung]
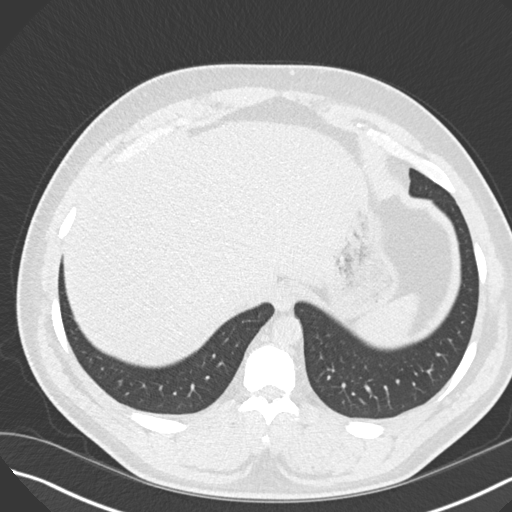
[im 47/148  lung]
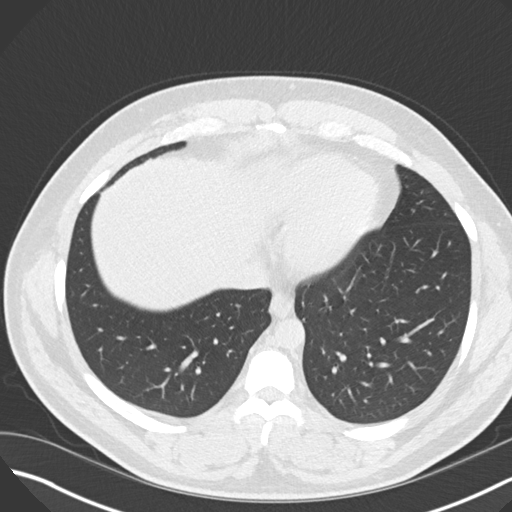
[im 54/148  mediastinal]
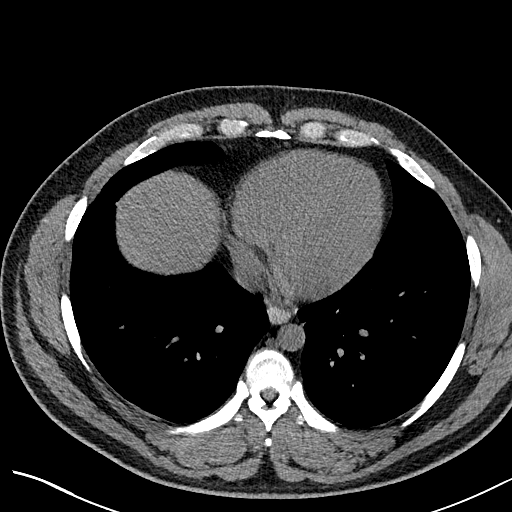
[im 54/148  lung]
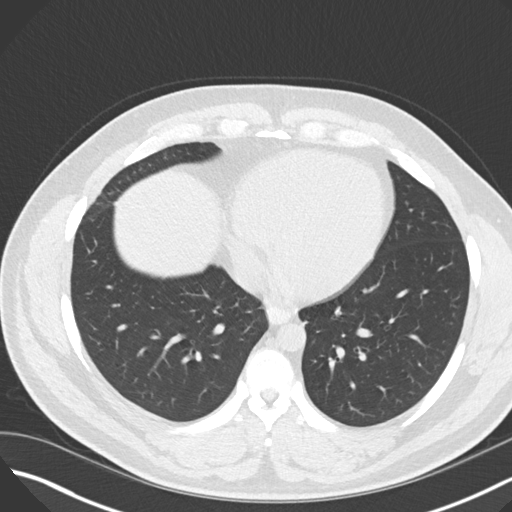
[im 67/148  lung]
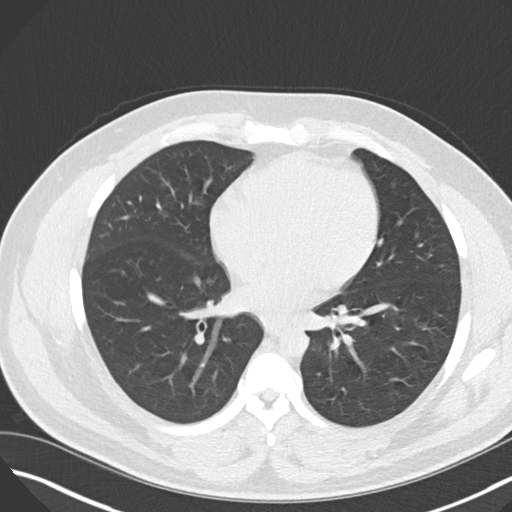
[im 81/148  lung]
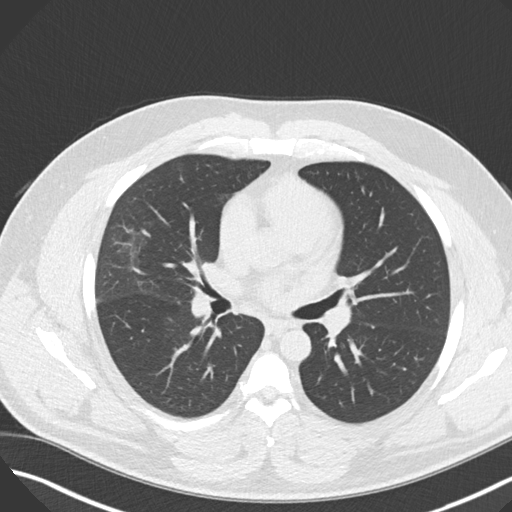
[im 94/148  lung]
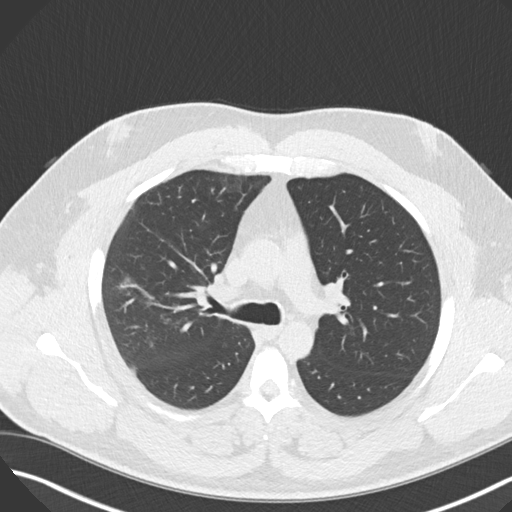
[im 101/148  mediastinal]
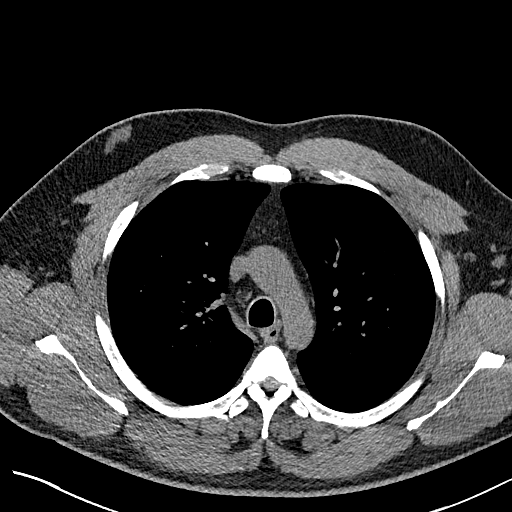
[im 101/148  lung]
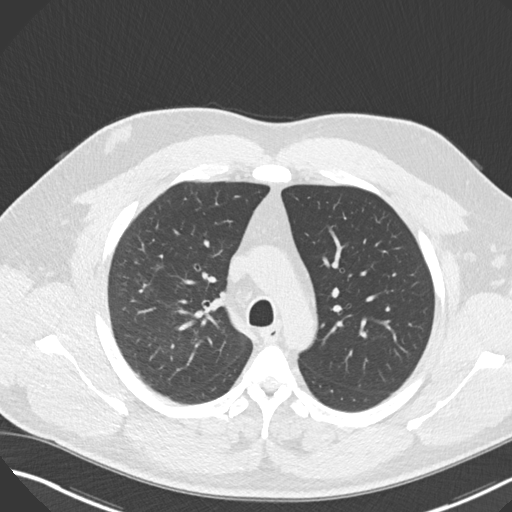
[im 114/148  lung]
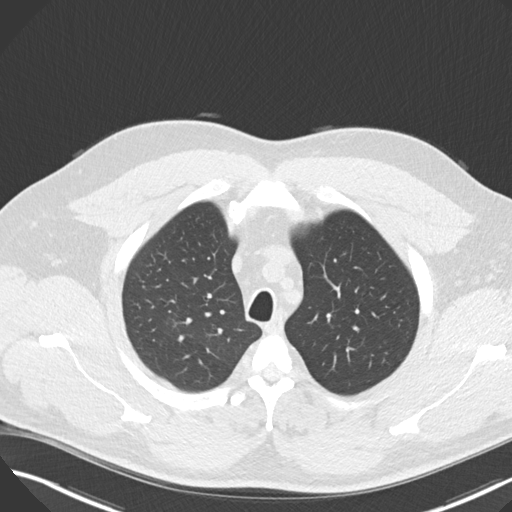
[im 127/148  lung]
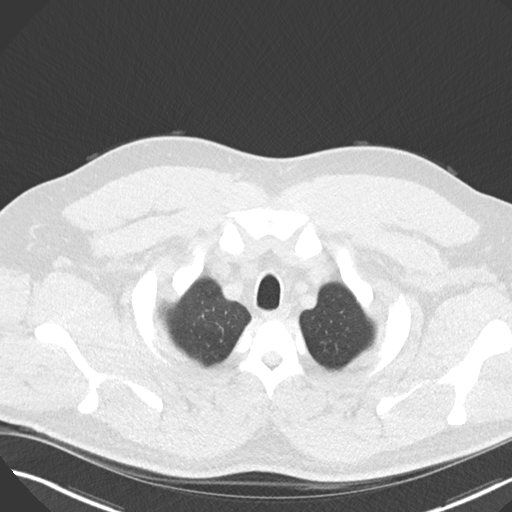
[im 141/148  lung]
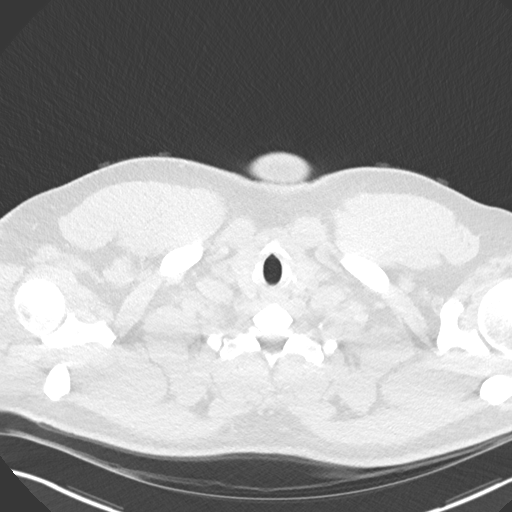

[Series 8: coronal · coronal · 0.62mm/px · 3 of 135 slices shown]
[im 27/135  lung]
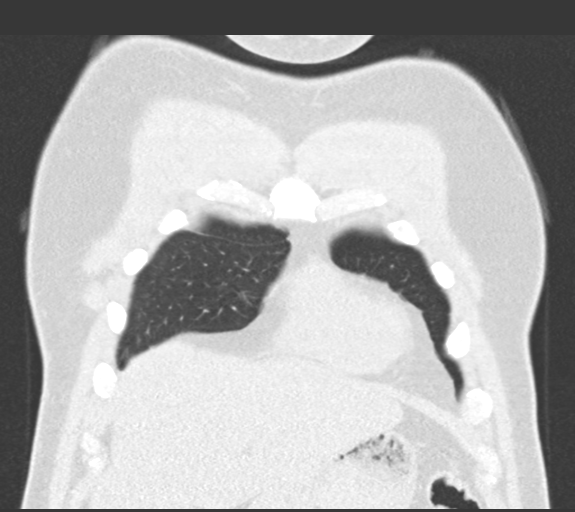
[im 54/135  lung]
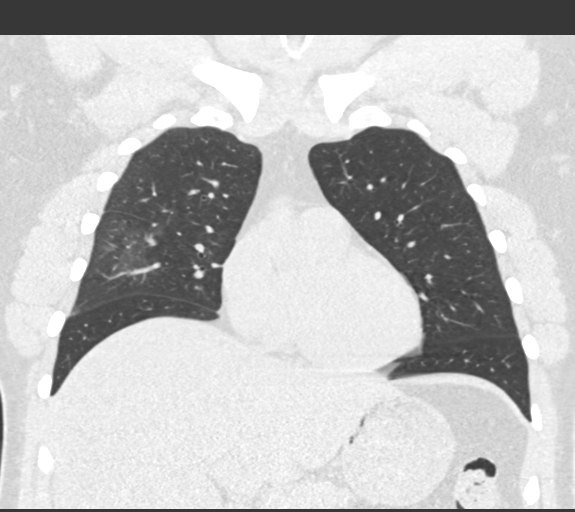
[im 81/135  lung]
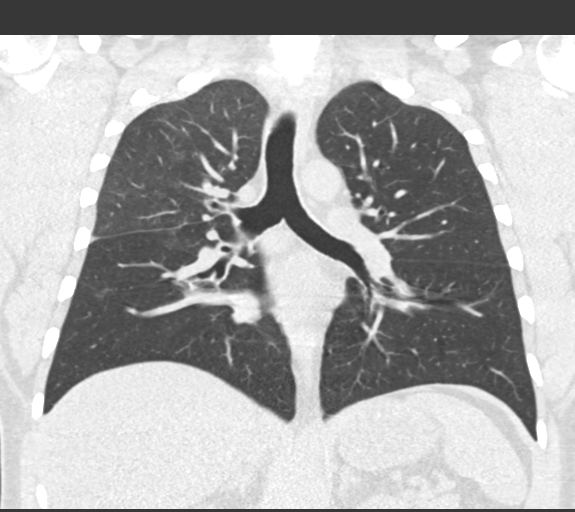

[15 of 36 positions shown; findings below may reference images not displayed]

FINDINGS: Cardiovascular: Heart size is normal. There is no significant
pericardial fluid, thickening or pericardial calcification. Aortic
atherosclerosis (mild). No definite coronary artery calcifications
are identified.

Mediastinum/Nodes: No pathologically enlarged mediastinal or hilar
lymph nodes. Please note that accurate exclusion of hilar adenopathy
is limited on noncontrast CT scans. Esophagus is unremarkable in
appearance. No axillary lymphadenopathy.

Lungs/Pleura: Previously noted patchy areas of consolidation and
ground-glass attenuation throughout the right middle lobe and right
upper lobe have nearly completely resolved. There is only a small
amount of residual peribronchovascular ground-glass attenuation
noted throughout these regions on today's examination, and these
findings presumably reflect a resolved multilobar bronchopneumonia.
No new acute consolidative airspace disease. No pleural effusions.
No suspicious appearing pulmonary nodules or masses. High-resolution
images demonstrate no other areas of significant ground-glass
attenuation, subpleural reticulation, parenchymal banding, traction
bronchiectasis or frank honeycombing to suggest interstitial lung
disease. A few scattered areas of mild air trapping are noted,
indicative of very mild small airways disease.

Upper Abdomen: Diffuse low attenuation throughout the visualized
hepatic parenchyma, compatible with a background of hepatic
steatosis.

Musculoskeletal: There are no aggressive appearing lytic or blastic
lesions noted in the visualized portions of the skeleton.
IMPRESSION: 1. No evidence of interstitial lung disease.
2. Near complete resolution of findings in the right middle and
upper lobe noted on prior chest CT from February 2016, compatible
with a resolved multilobar bronchopneumonia.
3. Aortic atherosclerosis (mild).
4. Mild air trapping, indicative of mild small airways disease.
5. Hepatic steatosis.

## 2017-09-07 ENCOUNTER — Other Ambulatory Visit: Payer: Self-pay | Admitting: Family Medicine

## 2017-10-26 ENCOUNTER — Other Ambulatory Visit: Payer: Self-pay | Admitting: Family Medicine

## 2017-10-26 DIAGNOSIS — I10 Essential (primary) hypertension: Secondary | ICD-10-CM

## 2017-11-15 ENCOUNTER — Other Ambulatory Visit: Payer: Self-pay | Admitting: Family Medicine

## 2018-01-06 ENCOUNTER — Other Ambulatory Visit: Payer: Self-pay | Admitting: Family Medicine

## 2018-01-20 ENCOUNTER — Other Ambulatory Visit: Payer: Self-pay | Admitting: Family Medicine

## 2018-01-20 DIAGNOSIS — I10 Essential (primary) hypertension: Secondary | ICD-10-CM

## 2018-03-05 ENCOUNTER — Other Ambulatory Visit: Payer: Self-pay | Admitting: Family Medicine

## 2018-04-18 ENCOUNTER — Other Ambulatory Visit: Payer: Self-pay | Admitting: Family Medicine

## 2018-04-18 DIAGNOSIS — I10 Essential (primary) hypertension: Secondary | ICD-10-CM

## 2018-05-21 ENCOUNTER — Ambulatory Visit: Payer: Managed Care, Other (non HMO)

## 2018-06-18 ENCOUNTER — Encounter

## 2018-06-18 ENCOUNTER — Encounter: Payer: Self-pay | Admitting: Family Medicine

## 2018-06-18 ENCOUNTER — Other Ambulatory Visit: Payer: Self-pay

## 2018-06-18 ENCOUNTER — Ambulatory Visit (INDEPENDENT_AMBULATORY_CARE_PROVIDER_SITE_OTHER): Payer: Managed Care, Other (non HMO) | Admitting: Family Medicine

## 2018-06-18 VITALS — BP 122/74 | HR 72 | Temp 98.3°F | Ht 72.0 in | Wt 265.0 lb

## 2018-06-18 DIAGNOSIS — I10 Essential (primary) hypertension: Secondary | ICD-10-CM | POA: Diagnosis not present

## 2018-06-18 DIAGNOSIS — N528 Other male erectile dysfunction: Secondary | ICD-10-CM

## 2018-06-18 DIAGNOSIS — E785 Hyperlipidemia, unspecified: Secondary | ICD-10-CM

## 2018-06-18 DIAGNOSIS — Z23 Encounter for immunization: Secondary | ICD-10-CM

## 2018-06-18 DIAGNOSIS — R7303 Prediabetes: Secondary | ICD-10-CM

## 2018-06-18 DIAGNOSIS — R6882 Decreased libido: Secondary | ICD-10-CM

## 2018-06-18 LAB — POCT GLYCOSYLATED HEMOGLOBIN (HGB A1C): HEMOGLOBIN A1C: 5.6 % (ref 4.0–5.6)

## 2018-06-18 MED ORDER — SILDENAFIL CITRATE 100 MG PO TABS: 100.0000 mg | ORAL_TABLET | Freq: Every day | ORAL | 0 refills | Status: DC | PRN

## 2018-06-18 NOTE — Progress Notes (Addendum)
   Subjective:    Patient ID: Keawe Fellner, male    DOB: 05-19-1971, 48 y.o.   MRN: [id number]   CC: Annual physical   HPI: Patient is a 48 yo male who presents today for his annual check up. Patient reports that he has been doing well for the past year. BP has been well controlled, he continue to take amlodipine as prescribed. Weight has been stable since last office visit. Patient denies any gout flare up n the past few months. Patient reports low sex drive and difficulty with sexual performance for the past year. Patient denies any chest pain, abdominal pain, shortness of breath, headaches, vision changes, dizziness.  Smoking status reviewed   ROS: all other systems were reviewed and are negative other than in the HPI   Past Medical History:  Diagnosis Date  . Gout   . Hypertension     History reviewed. No pertinent surgical history.  Past medical history, surgical, family, and social history reviewed and updated in the EMR as appropriate.  Objective:  BP 122/74   Pulse 72   Temp 98.3 F (36.8 C) (Oral)   Ht 6' (1.829 m)   Wt 265 lb (120.2 kg)   SpO2 98%   BMI 35.94 kg/m   Vitals and nursing note reviewed  General: NAD, pleasant, able to participate in exam Cardiac: RRR, normal heart sounds, no murmurs. 2+ radial and PT pulses bilaterally Respiratory: CTAB, normal effort, No wheezes, rales or rhonchi Abdomen: soft, nontender, nondistended, no hepatic or splenomegaly, +BS Extremities: no edema or cyanosis. WWP. Skin: warm and dry, no rashes noted Neuro: alert and oriented x4, no focal deficits Psych: Normal affect and mood   Assessment & Plan:    Essential hypertension BP today 120/72. At goal will continue current medication regimen.  --Continue Amlodipine 10 mg daily --BMP, CBC today  Prediabetes A1c last year was 5.8. Patient has been working on lifestyle modifications for the past year. Will recheck A1c today.  Hyperlipidemia Patient did not want  to start statin last year and has been working on lifestyle changes. ASCV low. Will repeat lipid panel today and decide on statin therapy based on results.  Other male erectile dysfunction Patient reports erectile dysfunction for the past year. He is having issue starting and maintaining erection. BP well controlled and patient is only on amlodipine. No other medications taken. Could be psychogenic. Will have a trial of sildenafil. Discuss with and testosterone level decrease which can affect libido and performance. --testosterone level today    Lovena Neighbours, MD [facility] PGY-3

## 2018-06-18 NOTE — Assessment & Plan Note (Signed)
Patient reports erectile dysfunction for the past year. He is having issue starting and maintaining erection. BP well controlled and patient is only on amlodipine. No other medications taken. Could be psychogenic. Will have a trial of sildenafil. Discuss with and testosterone level decrease which can affect libido and performance. --testosterone level today

## 2018-06-18 NOTE — Assessment & Plan Note (Signed)
A1c last year was 5.8. Patient has been working on lifestyle modifications for the past year. Will recheck A1c today.

## 2018-06-18 NOTE — Patient Instructions (Addendum)
It was great seeing you today! We have addressed the following issues today  1. We will do blood work today and I will follow up on results. 2. I have prescribed sildenafil for your ED, use as discussed 3. Continue taking your medications as prescribed.  If we did any lab work today, and the results require attention, either me or my nurse will get in touch with you. If everything is normal, you will get a letter in mail and a message via . If you don't hear from [place] in two weeks, please give [place] a call. Otherwise, we look forward to seeing you again at your next visit. If you have any questions or concerns before then, please call the clinic at [phone].  Please bring all your medications to every doctors visit  Sign up for My Chart to have easy access to your labs results, and communication with your Primary care physician. Please ask Front Desk for some assistance.   Please check-out at the front desk before leaving the clinic.    Take Care,   Dr. Sydnee Cabal

## 2018-06-18 NOTE — Assessment & Plan Note (Signed)
BP today 120/72. At goal will continue current medication regimen.  --Continue Amlodipine 10 mg daily --BMP, CBC today

## 2018-06-18 NOTE — Assessment & Plan Note (Signed)
Patient did not want to start statin last year and has been working on lifestyle changes. ASCV low. Will repeat lipid panel today and decide on statin therapy based on results.

## 2018-06-19 LAB — CBC WITH DIFFERENTIAL/PLATELET
Basophils Absolute: 0 10*3/uL (ref 0.0–0.2)
Basos: 1 %
EOS (ABSOLUTE): 0.1 10*3/uL (ref 0.0–0.4)
EOS: 2 %
Hematocrit: 44.7 % (ref 37.5–51.0)
Hemoglobin: 14.5 g/dL (ref 13.0–17.7)
Immature Grans (Abs): 0 10*3/uL (ref 0.0–0.1)
Immature Granulocytes: 0 %
Lymphocytes Absolute: 2.5 10*3/uL (ref 0.7–3.1)
Lymphs: 33 %
MCH: 25.3 pg — AB (ref 26.6–33.0)
MCHC: 32.4 g/dL (ref 31.5–35.7)
MCV: 78 fL — ABNORMAL LOW (ref 79–97)
Monocytes Absolute: 0.6 10*3/uL (ref 0.1–0.9)
Monocytes: 8 %
Neutrophils Absolute: 4.2 10*3/uL (ref 1.4–7.0)
Neutrophils: 56 %
Platelets: 290 10*3/uL (ref 150–450)
RBC: 5.73 x10E6/uL (ref 4.14–5.80)
RDW: 17.4 % — ABNORMAL HIGH (ref 11.6–15.4)
WBC: 7.5 10*3/uL (ref 3.4–10.8)

## 2018-06-19 LAB — LIPID PANEL
Chol/HDL Ratio: 2.9 ratio (ref 0.0–5.0)
Cholesterol, Total: 212 mg/dL — ABNORMAL HIGH (ref 100–199)
HDL: 73 mg/dL (ref >39)
LDL Calculated: 83 mg/dL (ref 0–99)
Triglycerides: 278 mg/dL — ABNORMAL HIGH (ref 0–149)
VLDL Cholesterol Cal: 56 mg/dL — ABNORMAL HIGH (ref 5–40)

## 2018-06-19 LAB — BASIC METABOLIC PANEL
BUN / CREAT RATIO: 11 (ref 9–20)
BUN: 13 mg/dL (ref 6–24)
CO2: 22 mmol/L (ref 20–29)
CREATININE: 1.17 mg/dL (ref 0.76–1.27)
Calcium: 9.6 mg/dL (ref 8.7–10.2)
Chloride: 104 mmol/L (ref 96–106)
GFR calc Af Amer: 86 mL/min/{1.73_m2} (ref >59)
GFR calc non Af Amer: 74 mL/min/{1.73_m2} (ref >59)
Glucose: 93 mg/dL (ref 65–99)
Potassium: 4.4 mmol/L (ref 3.5–5.2)
SODIUM: 141 mmol/L (ref 134–144)

## 2018-06-19 LAB — TESTOSTERONE: Testosterone: 330 ng/dL (ref 264–916)

## 2018-07-01 ENCOUNTER — Other Ambulatory Visit: Payer: Self-pay | Admitting: Family Medicine

## 2018-07-01 DIAGNOSIS — E785 Hyperlipidemia, unspecified: Secondary | ICD-10-CM

## 2018-07-01 MED ORDER — ROSUVASTATIN CALCIUM 20 MG PO TABS: 20.0000 mg | ORAL_TABLET | Freq: Every day | ORAL | 2 refills | Status: DC

## 2018-08-27 ENCOUNTER — Other Ambulatory Visit: Payer: Self-pay | Admitting: *Deleted

## 2018-08-27 DIAGNOSIS — E785 Hyperlipidemia, unspecified: Secondary | ICD-10-CM

## 2018-08-27 MED ORDER — ROSUVASTATIN CALCIUM 20 MG PO TABS: 20.0000 mg | ORAL_TABLET | Freq: Every day | ORAL | 2 refills | Status: DC

## 2018-11-10 ENCOUNTER — Other Ambulatory Visit: Payer: Self-pay

## 2018-11-10 MED ORDER — ALLOPURINOL 100 MG PO TABS: 100.0000 mg | ORAL_TABLET | Freq: Two times a day (BID) | ORAL | 3 refills | Status: DC

## 2018-11-17 ENCOUNTER — Other Ambulatory Visit: Payer: Self-pay | Admitting: Family Medicine

## 2018-11-21 ENCOUNTER — Other Ambulatory Visit: Payer: Self-pay | Admitting: Family Medicine

## 2018-11-21 DIAGNOSIS — E785 Hyperlipidemia, unspecified: Secondary | ICD-10-CM

## 2018-11-26 ENCOUNTER — Other Ambulatory Visit: Payer: Self-pay | Admitting: Family Medicine

## 2019-01-01 ENCOUNTER — Other Ambulatory Visit: Payer: Self-pay

## 2019-01-01 MED ORDER — ALLOPURINOL 100 MG PO TABS: 100.0000 mg | ORAL_TABLET | Freq: Two times a day (BID) | ORAL | 3 refills | Status: DC

## 2019-01-21 ENCOUNTER — Other Ambulatory Visit: Payer: Self-pay

## 2019-01-21 MED ORDER — ALLOPURINOL 100 MG PO TABS: 100.0000 mg | ORAL_TABLET | Freq: Two times a day (BID) | ORAL | 3 refills | Status: DC

## 2019-02-02 ENCOUNTER — Other Ambulatory Visit: Payer: Self-pay

## 2019-02-02 DIAGNOSIS — E785 Hyperlipidemia, unspecified: Secondary | ICD-10-CM

## 2019-02-02 MED ORDER — ROSUVASTATIN CALCIUM 20 MG PO TABS: 20.0000 mg | ORAL_TABLET | Freq: Every day | ORAL | 2 refills | Status: DC

## 2019-02-18 ENCOUNTER — Telehealth: Payer: Self-pay | Admitting: *Deleted

## 2019-02-18 NOTE — Telephone Encounter (Signed)
Pt needs a refill on allopurinol.  There are 2 on his list, one to take once daily and one to take twice daily.  Will forward to PCP to advise.  Christen Bame, CMA

## 2019-02-22 ENCOUNTER — Other Ambulatory Visit: Payer: Self-pay | Admitting: Family Medicine

## 2019-02-22 DIAGNOSIS — M1A9XX1 Chronic gout, unspecified, with tophus (tophi): Secondary | ICD-10-CM

## 2019-02-22 MED ORDER — ALLOPURINOL 100 MG PO TABS: 100.0000 mg | ORAL_TABLET | Freq: Every day | ORAL | 3 refills | Status: DC

## 2019-03-14 ENCOUNTER — Encounter: Payer: Self-pay | Admitting: Family Medicine

## 2019-03-30 ENCOUNTER — Other Ambulatory Visit: Payer: Self-pay | Admitting: *Deleted

## 2019-03-30 DIAGNOSIS — I10 Essential (primary) hypertension: Secondary | ICD-10-CM

## 2019-03-30 MED ORDER — AMLODIPINE BESYLATE 10 MG PO TABS: 10.0000 mg | ORAL_TABLET | Freq: Every day | ORAL | 3 refills | Status: DC

## 2019-05-25 ENCOUNTER — Other Ambulatory Visit: Payer: Self-pay | Admitting: Family Medicine

## 2019-05-25 DIAGNOSIS — E785 Hyperlipidemia, unspecified: Secondary | ICD-10-CM

## 2019-08-14 ENCOUNTER — Other Ambulatory Visit: Payer: Self-pay | Admitting: Family Medicine

## 2019-08-14 DIAGNOSIS — E785 Hyperlipidemia, unspecified: Secondary | ICD-10-CM

## 2020-01-25 ENCOUNTER — Other Ambulatory Visit: Payer: Self-pay | Admitting: Family Medicine

## 2020-01-25 DIAGNOSIS — M1A9XX1 Chronic gout, unspecified, with tophus (tophi): Secondary | ICD-10-CM

## 2020-03-22 ENCOUNTER — Other Ambulatory Visit: Payer: Self-pay | Admitting: Family Medicine

## 2020-03-22 DIAGNOSIS — I10 Essential (primary) hypertension: Secondary | ICD-10-CM

## 2020-05-08 ENCOUNTER — Other Ambulatory Visit: Payer: Self-pay | Admitting: Family Medicine

## 2020-05-08 DIAGNOSIS — E785 Hyperlipidemia, unspecified: Secondary | ICD-10-CM

## 2020-12-21 ENCOUNTER — Other Ambulatory Visit: Payer: Self-pay | Admitting: Family Medicine

## 2020-12-21 DIAGNOSIS — M1A9XX1 Chronic gout, unspecified, with tophus (tophi): Secondary | ICD-10-CM

## 2021-04-14 ENCOUNTER — Other Ambulatory Visit: Payer: Self-pay | Admitting: Family Medicine

## 2021-04-14 DIAGNOSIS — I10 Essential (primary) hypertension: Secondary | ICD-10-CM

## 2021-04-14 DIAGNOSIS — E785 Hyperlipidemia, unspecified: Secondary | ICD-10-CM

## 2021-06-07 ENCOUNTER — Other Ambulatory Visit: Payer: Self-pay | Admitting: Family Medicine

## 2021-06-07 DIAGNOSIS — I10 Essential (primary) hypertension: Secondary | ICD-10-CM

## 2021-06-09 NOTE — Telephone Encounter (Signed)
Pt needs an appointment. Has not been seen in office since Jan 2020.  Previously given 14-d refill in November and asked to schedule an appointment with [facility].    Will provide 7-d refill. He has not been since in nearly 3 years.   Katha Cabal, DO

## 2021-06-20 ENCOUNTER — Other Ambulatory Visit: Payer: Self-pay | Admitting: Family Medicine

## 2021-06-20 DIAGNOSIS — I10 Essential (primary) hypertension: Secondary | ICD-10-CM

## 2021-11-06 ENCOUNTER — Encounter: Payer: Self-pay | Admitting: *Deleted

## 2022-05-24 ENCOUNTER — Other Ambulatory Visit: Payer: Self-pay | Admitting: Urology

## 2022-05-24 DIAGNOSIS — R972 Elevated prostate specific antigen [PSA]: Secondary | ICD-10-CM

## 2022-06-29 ENCOUNTER — Ambulatory Visit
Admission: RE | Admit: 2022-06-29 | Discharge: 2022-06-29 | Disposition: A | Discharge status: Home / Self Care | Payer: 59 | Source: Ambulatory Visit | Attending: Urology | Admitting: Urology

## 2022-06-29 DIAGNOSIS — R972 Elevated prostate specific antigen [PSA]: Secondary | ICD-10-CM

## 2022-06-29 MED ORDER — GADOPICLENOL 0.5 MMOL/ML IV SOLN
10.0000 mL | Freq: Once | INTRAVENOUS | Status: AC | PRN
  Administered 2022-06-29: 10 mL via INTRAVENOUS

## 2023-01-10 ENCOUNTER — Other Ambulatory Visit (HOSPITAL_COMMUNITY): Payer: Self-pay | Admitting: Urology

## 2023-01-10 DIAGNOSIS — C61 Malignant neoplasm of prostate: Secondary | ICD-10-CM

## 2023-01-27 ENCOUNTER — Encounter (HOSPITAL_COMMUNITY)
Admission: RE | Admit: 2023-01-27 | Discharge: 2023-01-27 | Disposition: A | Discharge status: Home / Self Care | Payer: 59 | Source: Ambulatory Visit | Attending: Urology | Admitting: Urology

## 2023-01-27 DIAGNOSIS — C61 Malignant neoplasm of prostate: Secondary | ICD-10-CM | POA: Insufficient documentation

## 2023-01-27 MED ORDER — FLOTUFOLASTAT F 18 GALLIUM 296-5846 MBQ/ML IV SOLN
8.1200 | Freq: Once | INTRAVENOUS | Status: AC
  Administered 2023-01-27: 8.12 via INTRAVENOUS

## 2023-02-20 ENCOUNTER — Other Ambulatory Visit: Payer: Self-pay | Admitting: Urology

## 2023-04-11 NOTE — Patient Instructions (Signed)
SURGICAL WAITING ROOM VISITATION  Patients having surgery or a procedure may have no more than 2 support people in the waiting area - these visitors may rotate.    Children under the age of 80 must have an adult with them who is not the patient.  Due to an increase in RSV and influenza rates and associated hospitalizations, children ages 71 and under may not visit patients in [facility] hospitals.  If the patient needs to stay at the hospital during part of their recovery, the visitor guidelines for inpatient rooms apply. Pre-op nurse will coordinate an appropriate time for 1 support person to accompany patient in pre-op.  This support person may not rotate.    Please refer to the [facility] website for the visitor guidelines for Inpatients (after your surgery is over and you are in a regular room).    Your procedure is scheduled on: 04/24/23   Report to [facility] Main Entrance    Report to admitting at 9:00 AM   Call this number if you have problems the morning of surgery [phone]   Follow a clear liquid diet the day before surgery   Water Non-Citrus Juices (without pulp, NO RED-Apple, White grape, White cranberry) Black Coffee (NO MILK/CREAM OR CREAMERS, sugar ok)  Clear Tea (NO MILK/CREAM OR CREAMERS, sugar ok) regular and decaf                             Plain Jell-O (NO RED)                                           Fruit ices (not with fruit pulp, NO RED)                                     Popsicles (NO RED)                                                               Sports drinks like Gatorade (NO RED)              Nothing to drink after midnight.           If you have questions, please contact your surgeon's office.   FOLLOW BOWEL PREP AND ANY ADDITIONAL PRE OP INSTRUCTIONS YOU RECEIVED FROM YOUR SURGEON'S OFFICE!!!     Oral Hygiene is also important to reduce your risk of infection.                                    Remember - BRUSH YOUR TEETH  THE MORNING OF SURGERY WITH YOUR REGULAR TOOTHPASTE  DENTURES WILL BE REMOVED PRIOR TO SURGERY PLEASE DO NOT APPLY "Poly grip" OR ADHESIVES!!!   Stop all vitamins and herbal supplements 7 days before surgery.   Take these medicines the morning of surgery with A SIP OF WATER: Allopurinol, Amlodipine, Rosuvastatin             You may not have any  metal on your body including jewelry, and body piercing             Do not wear lotions, powders, cologne, or deodorant              Men may shave face and neck.   Do not bring valuables to the hospital. [facility] IS NOT             RESPONSIBLE   FOR VALUABLES.   Contacts, glasses, dentures or bridgework may not be worn into surgery.   Bring small overnight bag day of surgery.   DO NOT BRING YOUR HOME MEDICATIONS TO THE HOSPITAL. PHARMACY WILL DISPENSE MEDICATIONS LISTED ON YOUR MEDICATION LIST TO YOU DURING YOUR ADMISSION IN THE HOSPITAL!   Special Instructions: Bring a copy of your healthcare power of attorney and living will documents the day of surgery if you haven't scanned them before.              Please read over the following fact sheets you were given: IF YOU HAVE QUESTIONS ABOUT YOUR PRE-OP INSTRUCTIONS PLEASE CALL [name]    If you received a COVID test during your pre-op visit  it is requested that you wear a mask when out in public, stay away from anyone that may not be feeling well and notify your surgeon if you develop symptoms. If you test positive for Covid or have been in contact with anyone that has tested positive in the last 10 days please notify you surgeon.    [facility] - Preparing for Surgery Before surgery, you can play an important role.  Because skin is not sterile, your skin needs to be as free of germs as possible.  You can reduce the number of germs on your skin by washing with CHG (chlorahexidine gluconate) soap before surgery.  CHG is an antiseptic cleaner which kills germs and bonds with the  skin to continue killing germs even after washing. Please DO NOT use if you have an allergy to CHG or antibacterial soaps.  If your skin becomes reddened/irritated stop using the CHG and inform your nurse when you arrive at Short Stay. Do not shave (including legs and underarms) for at least 48 hours prior to the first CHG shower.  You may shave your face/neck.  Please follow these instructions carefully:  1.  Shower with CHG Soap the night before surgery and the  morning of surgery.  2.  If you choose to wash your hair, wash your hair first as usual with your normal  shampoo.  3.  After you shampoo, rinse your hair and body thoroughly to remove the shampoo.                             4.  Use CHG as you would any other liquid soap.  You can apply chg directly to the skin and wash.  Gently with a scrungie or clean washcloth.  5.  Apply the CHG Soap to your body ONLY FROM THE NECK DOWN.   Do   not use on face/ open                           Wound or open sores. Avoid contact with eyes, ears mouth and   genitals (private parts).  Wash face,  Genitals (private parts) with your normal soap.             6.  Wash thoroughly, paying special attention to the area where your    surgery  will be performed.  7.  Thoroughly rinse your body with warm water from the neck down.  8.  DO NOT shower/wash with your normal soap after using and rinsing off the CHG Soap.                9.  Pat yourself dry with a clean towel.            10.  Wear clean pajamas.            11.  Place clean sheets on your bed the night of your first shower and do not  sleep with pets. Day of Surgery : Do not apply any lotions/deodorants the morning of surgery.  Please wear clean clothes to the hospital/surgery center.  FAILURE TO FOLLOW THESE INSTRUCTIONS MAY RESULT IN THE CANCELLATION OF YOUR SURGERY  PATIENT SIGNATURE_________________________________  NURSE  SIGNATURE__________________________________  ________________________________________________________________________ WHAT IS A BLOOD TRANSFUSION? Blood Transfusion Information  A transfusion is the replacement of blood or some of its parts. Blood is made up of multiple cells which provide different functions. Red blood cells carry oxygen and are used for blood loss replacement. White blood cells fight against infection. Platelets control bleeding. Plasma helps clot blood. Other blood products are available for specialized needs, such as hemophilia or other clotting disorders. BEFORE THE TRANSFUSION  Who gives blood for transfusions?  Healthy volunteers who are fully evaluated to make sure their blood is safe. This is blood bank blood. Transfusion therapy is the safest it has ever been in the practice of medicine. Before blood is taken from a donor, a complete history is taken to make sure that person has no history of diseases nor engages in risky social behavior (examples are intravenous drug use or sexual activity with multiple partners). The donor's travel history is screened to minimize risk of transmitting infections, such as malaria. The donated blood is tested for signs of infectious diseases, such as HIV and hepatitis. The blood is then tested to be sure it is compatible with you in order to minimize the chance of a transfusion reaction. If you or a relative donates blood, this is often done in anticipation of surgery and is not appropriate for emergency situations. It takes many days to process the donated blood. RISKS AND COMPLICATIONS Although transfusion therapy is very safe and saves many lives, the main dangers of transfusion include:  Getting an infectious disease. Developing a transfusion reaction. This is an allergic reaction to something in the blood you were given. Every precaution is taken to prevent this. The decision to have a blood transfusion has been considered carefully  by your caregiver before blood is given. Blood is not given unless the benefits outweigh the risks. AFTER THE TRANSFUSION Right after receiving a blood transfusion, you will usually feel much better and more energetic. This is especially true if your red blood cells have gotten low (anemic). The transfusion raises the level of the red blood cells which carry oxygen, and this usually causes an energy increase. The nurse administering the transfusion will monitor you carefully for complications. HOME CARE INSTRUCTIONS  No special instructions are needed after a transfusion. You may find your energy is better. Speak with your caregiver about any limitations on activity for underlying diseases you may  have. SEEK MEDICAL CARE IF:  Your condition is not improving after your transfusion. You develop redness or irritation at the intravenous (IV) site. SEEK IMMEDIATE MEDICAL CARE IF:  Any of the following symptoms occur over the next 12 hours: Shaking chills. You have a temperature by mouth above 102 F (38.9 C), not controlled by medicine. Chest, back, or muscle pain. People around you feel you are not acting correctly or are confused. Shortness of breath or difficulty breathing. Dizziness and fainting. You get a rash or develop hives. You have a decrease in urine output. Your urine turns a dark color or changes to pink, red, or brown. Any of the following symptoms occur over the next 10 days: You have a temperature by mouth above 102 F (38.9 C), not controlled by medicine. Shortness of breath. Weakness after normal activity. The white part of the eye turns yellow (jaundice). You have a decrease in the amount of urine or are urinating less often. Your urine turns a dark color or changes to pink, red, or brown. Document Released: 05/17/2000 Document Revised: 08/12/2011 Document Reviewed: 01/04/2008 [facility] Patient Information 2014 [place],  [place].  _______________________________________________________________________

## 2023-04-11 NOTE — Progress Notes (Signed)
COVID Vaccine Completed: yes  Date of COVID positive in last 90 days:  PCP - Alfredo Martinez, MD Cardiologist -   PET- 01/27/23 Epic Chest x-ray -  EKG -  Stress Test -  ECHO - 02/29/16 Epic Cardiac Cath -  Pacemaker/ICD device last checked: Spinal Cord Stimulator:  Bowel Prep -   Sleep Study -  CPAP -   Fasting Blood Sugar - preDM Checks Blood Sugar _____ times a day  Last dose of GLP1 agonist-  N/A GLP1 instructions:  Hold 7 days before surgery    Last dose of SGLT-2 inhibitors-  N/A SGLT-2 instructions:  Hold 3 days before surgery    Blood Thinner Instructions:  Time Aspirin Instructions: ASA 81 Last Dose:  Activity level:  Can go up a flight of stairs and perform activities of daily living without stopping and without symptoms of chest pain or shortness of breath.  Able to exercise without symptoms  Unable to go up a flight of stairs without symptoms of     Anesthesia review:   Patient denies shortness of breath, fever, cough and chest pain at PAT appointment  Patient verbalized understanding of instructions that were given to them at the PAT appointment. Patient was also instructed that they will need to review over the PAT instructions again at home before surgery.

## 2023-04-15 ENCOUNTER — Other Ambulatory Visit: Payer: Self-pay

## 2023-04-15 ENCOUNTER — Encounter (HOSPITAL_COMMUNITY)
Admission: RE | Admit: 2023-04-15 | Discharge: 2023-04-15 | Disposition: A | Discharge status: Home / Self Care | Payer: 59 | Source: Ambulatory Visit | Attending: Urology

## 2023-04-15 ENCOUNTER — Encounter (HOSPITAL_COMMUNITY): Payer: Self-pay

## 2023-04-15 VITALS — BP 149/87 | HR 69 | Temp 98.6°F | Resp 16 | Ht 71.0 in | Wt 274.0 lb

## 2023-04-15 DIAGNOSIS — Z01818 Encounter for other preprocedural examination: Secondary | ICD-10-CM | POA: Diagnosis present

## 2023-04-15 DIAGNOSIS — I10 Essential (primary) hypertension: Secondary | ICD-10-CM | POA: Diagnosis not present

## 2023-04-15 HISTORY — DX: Unspecified kidney failure: N19

## 2023-04-15 HISTORY — DX: Prediabetes: R73.03

## 2023-04-15 LAB — CBC
HCT: 45.2 % (ref 39.0–52.0)
Hemoglobin: 14.9 g/dL (ref 13.0–17.0)
MCH: 25.9 pg — ABNORMAL LOW (ref 26.0–34.0)
MCHC: 33 g/dL (ref 30.0–36.0)
MCV: 78.5 fL — ABNORMAL LOW (ref 80.0–100.0)
Platelets: 267 10*3/uL (ref 150–400)
RBC: 5.76 MIL/uL (ref 4.22–5.81)
RDW: 18.1 % — ABNORMAL HIGH (ref 11.5–15.5)
WBC: 7.6 10*3/uL (ref 4.0–10.5)
nRBC: 0 % (ref 0.0–0.2)

## 2023-04-15 LAB — BASIC METABOLIC PANEL
Anion gap: 11 (ref 5–15)
BUN: 19 mg/dL (ref 6–20)
CO2: 22 mmol/L (ref 22–32)
Calcium: 9.3 mg/dL (ref 8.9–10.3)
Chloride: 104 mmol/L (ref 98–111)
Creatinine, Ser: 1.04 mg/dL (ref 0.61–1.24)
GFR, Estimated: >60 mL/min (ref >60)
Glucose, Bld: 94 mg/dL (ref 70–99)
Potassium: 4.1 mmol/L (ref 3.5–5.1)
Sodium: 137 mmol/L (ref 135–145)

## 2023-04-23 NOTE — H&P (Signed)
Office Visit Report     04/15/2023   --------------------------------------------------------------------------------   Harold Rodriguez  MRN: [id number]  DOB: 26-May-1971, 52 year old Male  SSN:    PRIMARY CARE:  Harold Spray, MD  PRIMARY CARE FAX:  [phone]  REFERRING:  Harold Rodriguez  PROVIDER:  Heloise Rodriguez, M.D.  TREATING:  Harold Rodriguez, [place]  LOCATION:  [facility] [phone]     --------------------------------------------------------------------------------   CC/HPI: Pt presents today for pre-operative history and physical exam in anticipation of robotic assisted lap radical prostatectomy with bilateral pelvic lymph node dissection by Dr. Laverle Rodriguez on 04/24/23. He is doing well and is without complaint.   Pt denies F/C, HA, CP, SOB, N/V, diarrhea/constipation, back pain, flank pain, hematuria, and dysuria.      CC: Prostate Cancer   Physician requesting consult: Dr. Karma Rodriguez  PCP: Dr. Sheryle Rodriguez   [name] is a 52 year old gentleman who was found to have an elevated PSA of 5.74 in October 2023 prompting an MRI of the prostate on 06/29/22 that indicated no suspicious lesions. 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   Family history: Father - Diagnosed at age 76 and treated with brachytherapy.   Imaging studies:  MRI (06/29/22): No EPE, SVI, LAD, or bone lesions. 10 mm borderline enlarged left external iliac lymph node, 7 mm right external iliac lymph node  PSMA PET scan (01/27/23): No metastatic disease. Uptake on right side of prostate.   PMH: He has a history of gout, hypertension, and hyperlipidemia.  PSH: No abdominal surgeries.   TNM stage: cT1c N0 M0  PSA: 6.23  Gleason score: 3+4=7 (GG 2)  Biopsy (12/19/22): 10/12 cores positive  Left: L lateral apex  (90%, 3+4=7). L apex (60%, 3+3=6), L lateral mid (70%, 3+4=7), L mid (50%, 3+3=6), L lateral base (10%, 3+4=7)  Right: R apex (80%, 3+4=7), R mid (50%, 3+4=7), R lateral mid (20%, 3+4=7), R base (30%, 3+4=7), R lateral base (30%, 3+4=7)  Prostate volume: 21.9 cc   Nomogram  OC disease: 50%  EPE: 48%  SVI: 8%  LNI: 8%  PFS (5 year, 10 year): 75%, 61%   Urinary function: IPSS is 1.  Erectile function: SHIM score is 15. He takes tadalafil. He has inconsistent results overall.     ALLERGIES: No Known Drug Allergies    MEDICATIONS: Allopurinol  Aspirin 81 mg tablet,chewable  Amlodipine Besylate 10 mg tablet  Olmesartan Medoxomil 40 mg tablet  Rosuvastatin Calcium  Tadalafil 20 mg tablet take 1 tablet prior to sexual activity PRN     GU PSH: Prostate Needle Biopsy - 12/19/2022       PSH Notes: diatek catheter for temporary dialysis    NON-GU PSH: Surgical Pathology, Gross And Microscopic Examination For Prostate Needle - 12/19/2022 Visit Complexity (formerly GPC1X) - 12/30/2022, 10/10/2022     GU PMH: Stress Incontinence - 04/02/2023 Prostate Cancer - 03/17/2023, - 02/11/2023, I had a long discussion with the patient using the his path report as a reference. We discussed his stage, grade and prognosis. We discussed the nature risks and benefits of active surveillance, radical prostatectomy, external beam radiotherapy, and brachytherapy. We discussed the role of androgen deprivation and chemotherapy in prostate cancer. We also discussed other ablative techniques such as HiFU and cryotherapy as well  as whole gland versus focal treatment. We discussed specifically how each treatment might affect bowel, bladder and sexual function. We discussed how each treatment might effect salvage treatments and active surveillance might lead to progression and more difficult treatment in the future. We also discussed long term affects of surg vs XRT. We also discussed gold seed and spaceoar if needed. All  questions answered. Check PET and they will consider their options. , - 12/30/2022 Elevated PSA, 22 g prostate on US/ f/u as planned. - 12/19/2022, disc PSAD and MRI results. Disc nature r/b/a to prostate bx and management of PCa. He will proceed with bx., - 10/10/2022, We discussed the nature, risks and benefits of PSA screening as well as the nature of elevated PSA (benign versus malignant). We discussed the possibility of prostate cancer and that as the PSA rises above the level of 2.5 and even over 1, the risk of prostate cancer on biopsy increases. We discussed the management of prostate cancer might include active surveillance or treatment depending on patient and cancer characteristics. In that context, we discussed the nature, risks and benefits of continued surveillance, other lab tests, transrectal ultrasound/prostate biopsy, or prostate MRI. All questions answered. PSA was sent. If PSa remains elevated will proceed with MRI. , - 05/01/2022 ED due to arterial insufficiency, increase tadalafil to 20 mg - 10/10/2022    NON-GU PMH: Muscle weakness (generalized) - 04/02/2023, - 03/17/2023 Other muscle spasm - 04/02/2023 Decreased libido, check T - disc T relation to PCa and PCa tx - 10/10/2022 Gout Hypercholesterolemia Hypertension    FAMILY HISTORY: 1 Daughter - Other 1 son - Other Prostate Cancer - Father   SOCIAL HISTORY: Marital Status: Married Preferred Language: English; Race: Black or African American Current Smoking Status: Patient has never smoked.   Tobacco Use Assessment Completed: Used Tobacco in last 30 days? Does not use smokeless tobacco. Does drink.  Does not use drugs. Drinks 2 caffeinated drinks per day. Has not had a blood transfusion.     Notes: Beer one per day    REVIEW OF SYSTEMS:    GU Review Male:   Patient denies frequent urination, hard to postpone urination, burning/ pain with urination, get up at night to urinate, leakage of urine, stream starts and stops,  trouble starting your stream, have to strain to urinate , erection problems, and penile pain.  Gastrointestinal (Upper):   Patient denies nausea, vomiting, and indigestion/ heartburn.  Gastrointestinal (Lower):   Patient denies diarrhea and constipation.  Constitutional:   Patient denies weight loss, night sweats, fatigue, and fever.  Skin:   Patient denies skin rash/ lesion and itching.  Eyes:   Patient denies blurred vision and double vision.  Ears/ Nose/ Throat:   Patient denies sore throat and sinus problems.  Hematologic/Lymphatic:   Patient denies swollen glands and easy bruising.  Cardiovascular:   Patient denies leg swelling and chest pains.  Respiratory:   Patient denies cough and shortness of breath.  Endocrine:   Patient denies excessive thirst.  Musculoskeletal:   Patient denies back pain and joint pain.  Neurological:   Patient denies headaches and dizziness.  Psychologic:   Patient denies depression and anxiety.   VITAL SIGNS:      04/15/2023 01:42 PM  Weight 270 lb / 122.47 kg  Height 71 in / 180.34 cm  BP 139/90 mmHg  Pulse 66 /min  Temperature 98.2 F / 36.7 C  BMI 37.7 kg/m   MULTI-SYSTEM PHYSICAL EXAMINATION:    Constitutional: Well-nourished.  No physical deformities. Normally developed. Good grooming.  Neck: Neck symmetrical, not swollen. Normal tracheal position.  Respiratory: Normal breath sounds. No labored breathing, no use of accessory muscles.   Cardiovascular: Regular rate and rhythm. No murmur, no gallop.   Lymphatic: No enlargement of neck, axillae, groin.  Skin: No paleness, no jaundice, no cyanosis. No lesion, no ulcer, no rash.  Neurologic / Psychiatric: Oriented to time, oriented to place, oriented to person. No depression, no anxiety, no agitation.  Gastrointestinal: No mass, no tenderness, no rigidity, obese abdomen.   Eyes: Normal conjunctivae. Normal eyelids.  Ears, Nose, Mouth, and Throat: Left ear no scars, no lesions, no masses. Right ear no  scars, no lesions, no masses. Nose no scars, no lesions, no masses. Normal hearing. Normal lips.  Musculoskeletal: Normal gait and station of head and neck.     Complexity of Data:  Records Review:   Previous Patient Records  Urine Test Review:   Urinalysis   04/15/23  Urinalysis  Urine Appearance Clear   Urine Color Yellow   Urine Glucose Neg mg/dL  Urine Bilirubin Neg mg/dL  Urine Ketones Neg mg/dL  Urine Specific Gravity 1.025   Urine Blood Neg ery/uL  Urine pH 5.5   Urine Protein Neg mg/dL  Urine Urobilinogen 0.2 mg/dL  Urine Nitrites Neg   Urine Leukocyte Esterase Neg leu/uL   PROCEDURES:          Urinalysis - 81003 Dipstick Dipstick Cont'd  Color: Yellow Bilirubin: Neg mg/dL  Appearance: Clear Ketones: Neg mg/dL  Specific Gravity: 1.610 Blood: Neg ery/uL  pH: 5.5 Protein: Neg mg/dL  Glucose: Neg mg/dL Urobilinogen: 0.2 mg/dL    Nitrites: Neg    Leukocyte Esterase: Neg leu/uL    ASSESSMENT:      ICD-10 Details  1 GU:   Prostate Cancer - C61    PLAN:           Schedule Return Visit/Planned Activity: Keep Scheduled Appointment - Schedule Surgery          Document Letter(s):  Created for Patient: Clinical Summary         Notes:   There are no changes in the patients history or physical exam since last evaluation by Dr. Laverle Rodriguez. Pt is scheduled to undergo RALP with BPLND on 04/24/23.   All pt's questions were answered to the best of my ability.         Next Appointment:      Next Appointment: 04/24/2023 11:15 AM    Appointment Type: Surgery     [facility]: [facility] [phone]    Provider: Ulyses Amor, PA    Reason for Visit: WL/OBS RA LAP RAD PROSTATECTOMY LEV 2 AND BPLND WITH DR. Laverle Rodriguez      * Signed by Ulyses Amor, PA on 04/15/23 at 2:06 PM (EST*

## 2023-04-24 ENCOUNTER — Encounter (HOSPITAL_COMMUNITY)
Admission: RE | Disposition: A | Discharge status: Home / Self Care | Payer: Self-pay | Source: Ambulatory Visit | Attending: Urology

## 2023-04-24 ENCOUNTER — Observation Stay (HOSPITAL_COMMUNITY)
Admission: RE | Admit: 2023-04-24 | Discharge: 2023-04-25 | Disposition: A | Discharge status: Home / Self Care | Payer: 59 | Source: Ambulatory Visit | Attending: Urology | Admitting: Urology

## 2023-04-24 ENCOUNTER — Encounter (HOSPITAL_COMMUNITY): Payer: Self-pay | Admitting: Urology

## 2023-04-24 ENCOUNTER — Other Ambulatory Visit: Payer: Self-pay

## 2023-04-24 ENCOUNTER — Ambulatory Visit (HOSPITAL_COMMUNITY): Payer: 59 | Admitting: Anesthesiology

## 2023-04-24 DIAGNOSIS — C61 Malignant neoplasm of prostate: Secondary | ICD-10-CM

## 2023-04-24 DIAGNOSIS — I10 Essential (primary) hypertension: Secondary | ICD-10-CM | POA: Insufficient documentation

## 2023-04-24 HISTORY — PX: LYMPHADENECTOMY: SHX5960

## 2023-04-24 HISTORY — PX: ROBOT ASSISTED LAPAROSCOPIC RADICAL PROSTATECTOMY: SHX5141

## 2023-04-24 LAB — TYPE AND SCREEN
ABO/RH(D): O POS
Antibody Screen: NEGATIVE

## 2023-04-24 LAB — HEMOGLOBIN AND HEMATOCRIT, BLOOD
HCT: 43.1 % (ref 39.0–52.0)
Hemoglobin: 13.9 g/dL (ref 13.0–17.0)

## 2023-04-24 LAB — ABO/RH: ABO/RH(D): O POS

## 2023-04-24 SURGERY — XI ROBOTIC ASSISTED LAPAROSCOPIC RADICAL PROSTATECTOMY LEVEL 2
Anesthesia: General

## 2023-04-24 MED ORDER — ALLOPURINOL 100 MG PO TABS
100.0000 mg | ORAL_TABLET | Freq: Every day | ORAL | Status: DC
  Administered 2023-04-25: 100 mg via ORAL
  Filled 2023-04-24: qty 1

## 2023-04-24 MED ORDER — PHENYLEPHRINE 80 MCG/ML (10ML) SYRINGE FOR IV PUSH (FOR BLOOD PRESSURE SUPPORT)
PREFILLED_SYRINGE | INTRAVENOUS | Status: AC
  Filled 2023-04-24: qty 10

## 2023-04-24 MED ORDER — HYOSCYAMINE SULFATE 0.125 MG SL SUBL: 0.1250 mg | SUBLINGUAL_TABLET | Freq: Four times a day (QID) | SUBLINGUAL | Status: DC | PRN

## 2023-04-24 MED ORDER — SODIUM CHLORIDE (PF) 0.9 % IJ SOLN
INTRAMUSCULAR | Status: AC
Start: 2023-04-24 — End: ?
  Filled 2023-04-24: qty 20

## 2023-04-24 MED ORDER — MORPHINE SULFATE (PF) 2 MG/ML IV SOLN
2.0000 mg | INTRAVENOUS | Status: DC | PRN
  Administered 2023-04-24: 3 mg via INTRAVENOUS
  Administered 2023-04-24 – 2023-04-25 (×4): 2 mg via INTRAVENOUS
  Filled 2023-04-24: qty 2
  Filled 2023-04-24 (×4): qty 1

## 2023-04-24 MED ORDER — KETOROLAC TROMETHAMINE 15 MG/ML IJ SOLN
15.0000 mg | Freq: Four times a day (QID) | INTRAMUSCULAR | Status: DC
  Administered 2023-04-24 – 2023-04-25 (×4): 15 mg via INTRAVENOUS
  Filled 2023-04-24 (×4): qty 1

## 2023-04-24 MED ORDER — DOCUSATE SODIUM 100 MG PO CAPS
100.0000 mg | ORAL_CAPSULE | Freq: Two times a day (BID) | ORAL | Status: DC
  Administered 2023-04-24 – 2023-04-25 (×2): 100 mg via ORAL
  Filled 2023-04-24 (×2): qty 1

## 2023-04-24 MED ORDER — HYDROMORPHONE HCL 2 MG/ML IJ SOLN
INTRAMUSCULAR | Status: AC
  Filled 2023-04-24: qty 1

## 2023-04-24 MED ORDER — ROSUVASTATIN CALCIUM 20 MG PO TABS
20.0000 mg | ORAL_TABLET | Freq: Every day | ORAL | Status: DC
  Administered 2023-04-25: 20 mg via ORAL
  Filled 2023-04-24: qty 1

## 2023-04-24 MED ORDER — TRIPLE ANTIBIOTIC 3.5-400-5000 EX OINT: 1.0000 | TOPICAL_OINTMENT | Freq: Three times a day (TID) | CUTANEOUS | Status: DC | PRN

## 2023-04-24 MED ORDER — DIPHENHYDRAMINE HCL 50 MG/ML IJ SOLN: 12.5000 mg | Freq: Four times a day (QID) | INTRAMUSCULAR | Status: DC | PRN

## 2023-04-24 MED ORDER — ONDANSETRON HCL 4 MG/2ML IJ SOLN: 4.0000 mg | INTRAMUSCULAR | Status: DC | PRN

## 2023-04-24 MED ORDER — SODIUM CHLORIDE 0.9 % IV BOLUS
1000.0000 mL | Freq: Once | INTRAVENOUS | Status: AC
  Administered 2023-04-24: 1000 mL via INTRAVENOUS

## 2023-04-24 MED ORDER — ACETAMINOPHEN 10 MG/ML IV SOLN
INTRAVENOUS | Status: AC
  Administered 2023-04-24: 1000 mg via INTRAVENOUS
  Filled 2023-04-24: qty 100

## 2023-04-24 MED ORDER — ACETAMINOPHEN 325 MG PO TABS: 325.0000 mg | ORAL_TABLET | Freq: Once | ORAL | Status: DC | PRN

## 2023-04-24 MED ORDER — BUPIVACAINE-EPINEPHRINE 0.25% -1:200000 IJ SOLN
INTRAMUSCULAR | Status: AC
  Filled 2023-04-24: qty 1

## 2023-04-24 MED ORDER — KETAMINE HCL 10 MG/ML IJ SOLN
INTRAMUSCULAR | Status: DC | PRN
  Administered 2023-04-24: 50 mg via INTRAVENOUS

## 2023-04-24 MED ORDER — LIDOCAINE 2% (20 MG/ML) 5 ML SYRINGE
INTRAMUSCULAR | Status: DC | PRN
  Administered 2023-04-24: 60 mg via INTRAVENOUS

## 2023-04-24 MED ORDER — LACTATED RINGERS IV SOLN: INTRAVENOUS | Status: DC

## 2023-04-24 MED ORDER — ACETAMINOPHEN 10 MG/ML IV SOLN: 1000.0000 mg | Freq: Once | INTRAVENOUS | Status: DC | PRN

## 2023-04-24 MED ORDER — IRBESARTAN 300 MG PO TABS
300.0000 mg | ORAL_TABLET | Freq: Every day | ORAL | Status: DC
  Administered 2023-04-25: 300 mg via ORAL
  Filled 2023-04-24: qty 1

## 2023-04-24 MED ORDER — HYDROMORPHONE HCL 1 MG/ML IJ SOLN
INTRAMUSCULAR | Status: AC
  Administered 2023-04-24: 0.5 mg via INTRAVENOUS
  Filled 2023-04-24: qty 1

## 2023-04-24 MED ORDER — SUGAMMADEX SODIUM 200 MG/2ML IV SOLN
INTRAVENOUS | Status: DC | PRN
  Administered 2023-04-24: 300 mg via INTRAVENOUS

## 2023-04-24 MED ORDER — FENTANYL CITRATE (PF) 250 MCG/5ML IJ SOLN
INTRAMUSCULAR | Status: AC
  Filled 2023-04-24: qty 5

## 2023-04-24 MED ORDER — MEPERIDINE HCL 50 MG/ML IJ SOLN: 6.2500 mg | INTRAMUSCULAR | Status: DC | PRN

## 2023-04-24 MED ORDER — SODIUM CHLORIDE (PF) 0.9 % IJ SOLN
INTRAMUSCULAR | Status: AC
  Filled 2023-04-24: qty 10

## 2023-04-24 MED ORDER — ONDANSETRON HCL 4 MG/2ML IJ SOLN
INTRAMUSCULAR | Status: DC | PRN
  Administered 2023-04-24: 4 mg via INTRAVENOUS

## 2023-04-24 MED ORDER — CHLORHEXIDINE GLUCONATE 0.12 % MT SOLN
15.0000 mL | Freq: Once | OROMUCOSAL | Status: AC
  Administered 2023-04-24: 15 mL via OROMUCOSAL

## 2023-04-24 MED ORDER — ONDANSETRON HCL 4 MG/2ML IJ SOLN
INTRAMUSCULAR | Status: AC
  Filled 2023-04-24: qty 2

## 2023-04-24 MED ORDER — LACTATED RINGERS IV SOLN: INTRAVENOUS | Status: AC

## 2023-04-24 MED ORDER — ZOLPIDEM TARTRATE 5 MG PO TABS
5.0000 mg | ORAL_TABLET | Freq: Every evening | ORAL | Status: DC | PRN
  Administered 2023-04-24: 5 mg via ORAL
  Filled 2023-04-24 (×2): qty 1

## 2023-04-24 MED ORDER — MIDAZOLAM HCL 2 MG/2ML IJ SOLN
INTRAMUSCULAR | Status: DC | PRN
  Administered 2023-04-24: 2 mg via INTRAVENOUS

## 2023-04-24 MED ORDER — CEFAZOLIN SODIUM-DEXTROSE 1-4 GM/50ML-% IV SOLN
1.0000 g | Freq: Three times a day (TID) | INTRAVENOUS | Status: AC
  Administered 2023-04-24 – 2023-04-25 (×2): 1 g via INTRAVENOUS
  Filled 2023-04-24 (×2): qty 50

## 2023-04-24 MED ORDER — PHENYLEPHRINE 80 MCG/ML (10ML) SYRINGE FOR IV PUSH (FOR BLOOD PRESSURE SUPPORT)
PREFILLED_SYRINGE | INTRAVENOUS | Status: DC | PRN
Start: 2023-04-24 — End: 2023-04-24
  Administered 2023-04-24 (×2): 160 ug via INTRAVENOUS

## 2023-04-24 MED ORDER — ROCURONIUM BROMIDE 10 MG/ML (PF) SYRINGE
PREFILLED_SYRINGE | INTRAVENOUS | Status: AC
  Filled 2023-04-24: qty 10

## 2023-04-24 MED ORDER — DIPHENHYDRAMINE HCL 12.5 MG/5ML PO ELIX: 12.5000 mg | ORAL_SOLUTION | Freq: Four times a day (QID) | ORAL | Status: DC | PRN

## 2023-04-24 MED ORDER — PROPOFOL 10 MG/ML IV BOLUS
INTRAVENOUS | Status: DC | PRN
  Administered 2023-04-24: 200 mg via INTRAVENOUS

## 2023-04-24 MED ORDER — FENTANYL CITRATE (PF) 250 MCG/5ML IJ SOLN
INTRAMUSCULAR | Status: DC | PRN
  Administered 2023-04-24 (×2): 100 ug via INTRAVENOUS
  Administered 2023-04-24 (×3): 50 ug via INTRAVENOUS

## 2023-04-24 MED ORDER — DROPERIDOL 2.5 MG/ML IJ SOLN
0.6250 mg | Freq: Once | INTRAMUSCULAR | Status: DC | PRN
Start: 2023-04-24 — End: 2023-04-24

## 2023-04-24 MED ORDER — DEXAMETHASONE SODIUM PHOSPHATE 10 MG/ML IJ SOLN
INTRAMUSCULAR | Status: AC
  Filled 2023-04-24: qty 1

## 2023-04-24 MED ORDER — HYDROMORPHONE HCL 1 MG/ML IJ SOLN
INTRAMUSCULAR | Status: DC | PRN
Start: 2023-04-24 — End: 2023-04-24
  Administered 2023-04-24 (×4): .5 mg via INTRAVENOUS

## 2023-04-24 MED ORDER — SULFAMETHOXAZOLE-TRIMETHOPRIM 800-160 MG PO TABS: 1.0000 | ORAL_TABLET | Freq: Two times a day (BID) | ORAL | 0 refills | Status: DC

## 2023-04-24 MED ORDER — ROCURONIUM BROMIDE 10 MG/ML (PF) SYRINGE
PREFILLED_SYRINGE | INTRAVENOUS | Status: DC | PRN
  Administered 2023-04-24: 10 mg via INTRAVENOUS
  Administered 2023-04-24: 20 mg via INTRAVENOUS
  Administered 2023-04-24: 80 mg via INTRAVENOUS

## 2023-04-24 MED ORDER — CEFAZOLIN SODIUM-DEXTROSE 2-4 GM/100ML-% IV SOLN
2.0000 g | INTRAVENOUS | Status: AC
  Administered 2023-04-24: 2 g via INTRAVENOUS
  Filled 2023-04-24: qty 100

## 2023-04-24 MED ORDER — PROPOFOL 10 MG/ML IV BOLUS
INTRAVENOUS | Status: AC
Start: 2023-04-24 — End: ?
  Filled 2023-04-24: qty 20

## 2023-04-24 MED ORDER — KETAMINE HCL 50 MG/5ML IJ SOSY
PREFILLED_SYRINGE | INTRAMUSCULAR | Status: AC
  Filled 2023-04-24: qty 5

## 2023-04-24 MED ORDER — TRAMADOL HCL 50 MG PO TABS: 50.0000 mg | ORAL_TABLET | Freq: Four times a day (QID) | ORAL | 0 refills | Status: DC | PRN

## 2023-04-24 MED ORDER — AMLODIPINE BESYLATE 10 MG PO TABS
10.0000 mg | ORAL_TABLET | Freq: Every day | ORAL | Status: DC
  Administered 2023-04-25: 10 mg via ORAL
  Filled 2023-04-24: qty 1

## 2023-04-24 MED ORDER — BUPIVACAINE-EPINEPHRINE 0.25% -1:200000 IJ SOLN
INTRAMUSCULAR | Status: DC | PRN
  Administered 2023-04-24: 30 mL

## 2023-04-24 MED ORDER — SODIUM CHLORIDE 0.9 % IV SOLN: INTRAVENOUS | Status: DC | PRN

## 2023-04-24 MED ORDER — ACETAMINOPHEN 160 MG/5ML PO SOLN: 325.0000 mg | Freq: Once | ORAL | Status: DC | PRN

## 2023-04-24 MED ORDER — MIDAZOLAM HCL 2 MG/2ML IJ SOLN
INTRAMUSCULAR | Status: AC
  Filled 2023-04-24: qty 2

## 2023-04-24 MED ORDER — STERILE WATER FOR IRRIGATION IR SOLN
Status: DC | PRN
  Administered 2023-04-24: 1000 mL

## 2023-04-24 MED ORDER — KCL IN DEXTROSE-NACL 20-5-0.45 MEQ/L-%-% IV SOLN
INTRAVENOUS | Status: DC
  Filled 2023-04-24 (×3): qty 1000

## 2023-04-24 MED ORDER — PHENYLEPHRINE HCL-NACL 20-0.9 MG/250ML-% IV SOLN
INTRAVENOUS | Status: DC | PRN
  Administered 2023-04-24: 50 ug/min via INTRAVENOUS

## 2023-04-24 MED ORDER — LIDOCAINE HCL (PF) 2 % IJ SOLN
INTRAMUSCULAR | Status: AC
  Filled 2023-04-24: qty 5

## 2023-04-24 MED ORDER — DOCUSATE SODIUM 100 MG PO CAPS: 100.0000 mg | ORAL_CAPSULE | Freq: Two times a day (BID) | ORAL | Status: AC

## 2023-04-24 MED ORDER — ORAL CARE MOUTH RINSE: 15.0000 mL | Freq: Once | OROMUCOSAL | Status: AC

## 2023-04-24 MED ORDER — FENTANYL CITRATE (PF) 100 MCG/2ML IJ SOLN
INTRAMUSCULAR | Status: AC
  Filled 2023-04-24: qty 2

## 2023-04-24 MED ORDER — HEMOSTATIC AGENTS (NO CHARGE) OPTIME
TOPICAL | Status: DC | PRN
  Administered 2023-04-24: 1 via TOPICAL

## 2023-04-24 MED ORDER — DEXAMETHASONE SODIUM PHOSPHATE 10 MG/ML IJ SOLN
INTRAMUSCULAR | Status: DC | PRN
  Administered 2023-04-24: 10 mg via INTRAVENOUS

## 2023-04-24 MED ORDER — HYDROMORPHONE HCL 1 MG/ML IJ SOLN
0.2500 mg | INTRAMUSCULAR | Status: DC | PRN
  Administered 2023-04-24: 0.5 mg via INTRAVENOUS

## 2023-04-24 MED ORDER — ACETAMINOPHEN 325 MG PO TABS: 650.0000 mg | ORAL_TABLET | ORAL | Status: DC | PRN

## 2023-04-24 SURGICAL SUPPLY — 60 items
APPLICATOR COTTON TIP 6 STRL (MISCELLANEOUS) ×2 IMPLANT
APPLICATOR COTTON TIP 6IN STRL (MISCELLANEOUS) ×2
BAG COUNTER SPONGE SURGICOUNT (BAG) IMPLANT
CATH FOLEY 2WAY SLVR 18FR 30CC (CATHETERS) ×2 IMPLANT
CATH ROBINSON RED A/P 16FR (CATHETERS) ×2 IMPLANT
CATH ROBINSON RED A/P 8FR (CATHETERS) ×2 IMPLANT
CATH TIEMANN FOLEY 18FR 5CC (CATHETERS) ×2 IMPLANT
CHLORAPREP W/TINT 26 (MISCELLANEOUS) ×2 IMPLANT
CLIP LIGATING HEM O LOK PURPLE (MISCELLANEOUS) ×2 IMPLANT
COVER SURGICAL LIGHT HANDLE (MISCELLANEOUS) ×2 IMPLANT
COVER TIP SHEARS 8 DVNC (MISCELLANEOUS) ×2 IMPLANT
CUTTER ECHEON FLEX ENDO 45 340 (ENDOMECHANICALS) ×2 IMPLANT
DERMABOND ADVANCED .7 DNX12 (GAUZE/BANDAGES/DRESSINGS) ×2 IMPLANT
DRAIN CHANNEL RND F F (WOUND CARE) IMPLANT
DRAPE ARM DVNC X/XI (DISPOSABLE) ×8 IMPLANT
DRAPE COLUMN DVNC XI (DISPOSABLE) ×2 IMPLANT
DRAPE SURG IRRIG POUCH 19X23 (DRAPES) ×2 IMPLANT
DRIVER NDL LRG 8 DVNC XI (INSTRUMENTS) ×4 IMPLANT
DRIVER NDLE LRG 8 DVNC XI (INSTRUMENTS) ×4
DRSG TEGADERM 4X4.75 (GAUZE/BANDAGES/DRESSINGS) ×2 IMPLANT
ELECT PENCIL ROCKER SW 15FT (MISCELLANEOUS) ×2 IMPLANT
ELECT REM PT RETURN 15FT ADLT (MISCELLANEOUS) ×2 IMPLANT
FORCEPS BPLR LNG DVNC XI (INSTRUMENTS) ×2 IMPLANT
FORCEPS PROGRASP DVNC XI (FORCEP) ×2 IMPLANT
GAUZE SPONGE 4X4 12PLY STRL (GAUZE/BANDAGES/DRESSINGS) ×2 IMPLANT
GLOVE BIO SURGEON STRL SZ 6.5 (GLOVE) ×2 IMPLANT
GLOVE SURG LX STRL 7.5 STRW (GLOVE) ×4 IMPLANT
GOWN STRL REUS W/ TWL XL LVL3 (GOWN DISPOSABLE) ×4 IMPLANT
GOWN STRL SURGICAL XL XLNG (GOWN DISPOSABLE) ×2 IMPLANT
HEMOSTAT SURGICEL 4X8 (HEMOSTASIS) IMPLANT
HOLDER FOLEY CATH W/STRAP (MISCELLANEOUS) ×2 IMPLANT
IRRIG SUCT STRYKERFLOW 2 WTIP (MISCELLANEOUS) ×2
IRRIGATION SUCT STRKRFLW 2 WTP (MISCELLANEOUS) ×2 IMPLANT
IV LACTATED RINGERS 1000ML (IV SOLUTION) ×2 IMPLANT
KIT TURNOVER KIT A (KITS) IMPLANT
NDL SAFETY ECLIPSE 18X1.5 (NEEDLE) IMPLANT
PACK ROBOT UROLOGY CUSTOM (CUSTOM PROCEDURE TRAY) ×2 IMPLANT
PLUG CATH AND CAP STRL 200 (CATHETERS) ×2 IMPLANT
RELOAD STAPLE 45 4.1 GRN THCK (STAPLE) ×2 IMPLANT
SCISSORS MNPLR CVD DVNC XI (INSTRUMENTS) ×2 IMPLANT
SEAL UNIV 5-12 XI (MISCELLANEOUS) ×8 IMPLANT
SET CYSTO W/LG BORE CLAMP LF (SET/KITS/TRAYS/PACK) IMPLANT
SET TUBE SMOKE EVAC HIGH FLOW (TUBING) ×2 IMPLANT
SOL ELECTROSURG ANTI STICK (MISCELLANEOUS) ×2
SOL PREP POV-IOD 4OZ 10% (MISCELLANEOUS) ×2 IMPLANT
SOLUTION ELECTROSURG ANTI STCK (MISCELLANEOUS) ×2 IMPLANT
SPIKE FLUID TRANSFER (MISCELLANEOUS) ×2 IMPLANT
STAPLE RELOAD 45 GRN (STAPLE) ×2
SUT ETHILON 3 0 PS 1 (SUTURE) ×2 IMPLANT
SUT MNCRL 3 0 RB1 (SUTURE) ×2 IMPLANT
SUT MNCRL 3 0 VIOLET RB1 (SUTURE) ×2 IMPLANT
SUT MNCRL AB 4-0 PS2 18 (SUTURE) ×4 IMPLANT
SUT PDS PLUS AB 0 CT-2 (SUTURE) ×4 IMPLANT
SUT VIC AB 0 CT1 27XBRD ANTBC (SUTURE) ×4 IMPLANT
SUT VIC AB 2-0 SH 27X BRD (SUTURE) ×2 IMPLANT
SUT VIC AB 3-0 SH 27X BRD (SUTURE) IMPLANT
SYR 27GX1/2 1ML LL SAFETY (SYRINGE) ×2 IMPLANT
TOWEL OR NON WOVEN STRL DISP B (DISPOSABLE) ×2 IMPLANT
TROCAR Z THREAD OPTICAL 12X100 (TROCAR) IMPLANT
WATER STERILE IRR 1000ML POUR (IV SOLUTION) ×2 IMPLANT

## 2023-04-24 NOTE — Plan of Care (Signed)
  Problem: Education: Goal: Knowledge of the procedure and recovery process will improve Outcome: Progressing   Problem: Bowel/Gastric: Goal: Gastrointestinal status for postoperative course will improve Outcome: Progressing   Problem: Pain Management: Goal: General experience of comfort will improve Outcome: Progressing   Problem: Skin Integrity: Goal: Demonstration of wound healing without infection will improve Outcome: Progressing

## 2023-04-24 NOTE — Discharge Instructions (Signed)

## 2023-04-24 NOTE — Interval H&P Note (Signed)
History and Physical Interval Note:  04/24/2023 10:36 AM  Harold Rodriguez  has presented today for surgery, with the diagnosis of PROSTATE CANCER.  The various methods of treatment have been discussed with the patient and family. After consideration of risks, benefits and other options for treatment, the patient has consented to  Procedure(s) with comments: XI ROBOTIC ASSISTED LAPAROSCOPIC RADICAL PROSTATECTOMY LEVEL 2 (N/A) - 210 MINUTES NEEDED FOR CASE BILATERAL PELVIC LYMPHADENECTOMY (Bilateral) as a surgical intervention.  The patient's history has been reviewed, patient examined, no change in status, stable for surgery.  I have reviewed the patient's chart and labs.  Questions were answered to the patient's satisfaction.     [employer]

## 2023-04-24 NOTE — Anesthesia Preprocedure Evaluation (Addendum)
Anesthesia Evaluation  Patient identified by MRN, date of birth, ID band Patient awake    Reviewed: Allergy & Precautions, NPO status , Patient's Chart, lab work & pertinent test results  Airway Mallampati: III  TM Distance: >3 FB Neck ROM: Full    Dental  (+) Teeth Intact, Dental Advisory Given   Pulmonary    breath sounds clear to auscultation       Cardiovascular hypertension, Pt. on medications  Rhythm:Regular Rate:Normal     Neuro/Psych negative neurological ROS  negative psych ROS   GI/Hepatic negative GI ROS, Neg liver ROS,,,  Endo/Other  negative endocrine ROS    Renal/GU Renal disease     Musculoskeletal negative musculoskeletal ROS (+)    Abdominal   Peds  Hematology negative hematology ROS (+)   Anesthesia Other Findings   Reproductive/Obstetrics                             Anesthesia Physical Anesthesia Plan  ASA: 2  Anesthesia Plan: General   Post-op Pain Management: Tylenol PO (pre-op)* and Toradol IV (intra-op)*   Induction: Intravenous  PONV Risk Score and Plan: 3 and Ondansetron, Dexamethasone and Midazolam  Airway Management Planned: Oral ETT  Additional Equipment: None  Intra-op Plan:   Post-operative Plan: Extubation in OR  Informed Consent: I have reviewed the patients History and Physical, chart, labs and discussed the procedure including the risks, benefits and alternatives for the proposed anesthesia with the patient or authorized representative who has indicated his/her understanding and acceptance.     Dental advisory given  Plan Discussed with: CRNA  Anesthesia Plan Comments:        Anesthesia Quick Evaluation

## 2023-04-24 NOTE — Anesthesia Postprocedure Evaluation (Signed)
Anesthesia Post Note  Patient: Harold Rodriguez  Procedure(s) Performed: XI ROBOTIC ASSISTED LAPAROSCOPIC RADICAL PROSTATECTOMY LEVEL 2 BILATERAL PELVIC LYMPHADENECTOMY (Bilateral)     Patient location during evaluation: PACU Anesthesia Type: General Level of consciousness: awake and alert Pain management: pain level controlled Vital Signs Assessment: post-procedure vital signs reviewed and stable Respiratory status: spontaneous breathing, nonlabored ventilation, respiratory function stable and patient connected to nasal cannula oxygen Cardiovascular status: blood pressure returned to baseline and stable Postop Assessment: no apparent nausea or vomiting Anesthetic complications: no  No notable events documented.  Last Vitals:  Vitals:   04/24/23 1515 04/24/23 1530  BP: (!) 143/78 (!) 160/87  Pulse: 73 77  Resp: 12 13  Temp:    SpO2: 97% 98%    Last Pain:  Vitals:   04/24/23 1530  TempSrc:   PainSc: Asleep                 Shelton Silvas

## 2023-04-24 NOTE — Transfer of Care (Signed)
Immediate Anesthesia Transfer of Care Note  Patient: Harold Rodriguez  Procedure(s) Performed: XI ROBOTIC ASSISTED LAPAROSCOPIC RADICAL PROSTATECTOMY LEVEL 2 BILATERAL PELVIC LYMPHADENECTOMY (Bilateral)  Patient Location: PACU  Anesthesia Type:General  Level of Consciousness: awake, alert , and oriented  Airway & Oxygen Therapy: Patient Spontanous Breathing and Patient connected to face mask oxygen  Post-op Assessment: Report given to RN and Post -op Vital signs reviewed and stable  Post vital signs: Reviewed and stable  Last Vitals:  Vitals Value Taken Time  BP    Temp    Pulse 78 04/24/23 1413  Resp 10 04/24/23 1413  SpO2 100 % 04/24/23 1413  Vitals shown include unfiled device data.  Last Pain:  Vitals:   04/24/23 0924  TempSrc:   PainSc: 0-No pain         Complications: No notable events documented.

## 2023-04-24 NOTE — Op Note (Signed)
Preoperative diagnosis: Clinically localized adenocarcinoma of the prostate (clinical stage T1c N0 M0)  Postoperative diagnosis: Clinically localized adenocarcinoma of the prostate (clinical stage T1c N0 M0)  Procedure:  Robotic assisted laparoscopic radical prostatectomy (bilateral nerve sparing) Bilateral robotic assisted laparoscopic pelvic lymphadenectomy  Surgeon: Moody Bruins. M.D.  Assistant: Harrie Foreman, PA-C  An assistant was required for this surgical procedure.  The duties of the assistant included but were not limited to suctioning, passing suture, camera manipulation, retraction. This procedure would not be able to be performed without an Geophysicist/field seismologist.  Anesthesia: General  Complications: None  EBL: 50 mL  IVF:  1500 mL crystalloid  Specimens: Prostate and seminal vesicles Right pelvic lymph nodes Left pelvic lymph nodes  Disposition of specimens: Pathology  Drains: 20 Fr coude catheter # 19 Blake pelvic drain  Indication: Harold Rodriguez is a 52 y.o. year old patient with clinically localized prostate cancer.  After a thorough review of the management options for treatment of prostate cancer, he elected to proceed with surgical therapy and the above procedure(s).  We have discussed the potential benefits and risks of the procedure, side effects of the proposed treatment, the likelihood of the patient achieving the goals of the procedure, and any potential problems that might occur during the procedure or recuperation. Informed consent has been obtained.  Description of procedure:  The patient was taken to the operating room and a general anesthetic was administered. He was given preoperative antibiotics, placed in the dorsal lithotomy position, and prepped and draped in the usual sterile fashion. Next a preoperative timeout was performed. A urethral catheter was placed into the bladder and a site was selected near the umbilicus for placement of the camera  port. This was placed using a standard open Hassan technique which allowed entry into the peritoneal cavity under direct vision and without difficulty. An 8 mm robotic port was placed and a pneumoperitoneum established. The camera was then used to inspect the abdomen and there was no evidence of any intra-abdominal injuries or other abnormalities. The remaining abdominal ports were then placed. 8 mm robotic ports were placed in the right lower quadrant, left lower quadrant, and far left lateral abdominal wall. A 5 mm port was placed in the right upper quadrant and a 12 mm port was placed in the right lateral abdominal wall for laparoscopic assistance. All ports were placed under direct vision without difficulty. The surgical cart was then docked.   Utilizing the cautery scissors, the bladder was reflected posteriorly allowing entry into the space of Retzius and identification of the endopelvic fascia and prostate. The periprostatic fat was then removed from the prostate allowing full exposure of the endopelvic fascia. The endopelvic fascia was then incised from the apex back to the base of the prostate bilaterally and the underlying levator muscle fibers were swept laterally off the prostate thereby isolating the dorsal venous complex. The dorsal vein was then stapled and divided with a 45 mm Flex Echelon stapler. Attention then turned to the bladder neck which was divided anteriorly thereby allowing entry into the bladder and exposure of the urethral catheter. The catheter balloon was deflated and the catheter was brought into the operative field and used to retract the prostate anteriorly. The posterior bladder neck was then examined and was divided allowing further dissection between the bladder and prostate posteriorly until the vasa deferentia and seminal vessels were identified. The vasa deferentia were isolated, divided, and lifted anteriorly. The seminal vesicles were dissected down to  their tips with care  to control the seminal vascular arterial blood supply. These structures were then lifted anteriorly and the space between Denonvillier's fascia and the anterior rectum was developed with a combination of sharp and blunt dissection. This isolated the vascular pedicles of the prostate.  The lateral prostatic fascia was then sharply incised allowing release of the neurovascular bundles bilaterally. The vascular pedicles of the prostate were then ligated with Weck clips between the prostate and neurovascular bundles and divided with sharp cold scissor dissection resulting in neurovascular bundle preservation. The neurovascular bundles were then separated off the apex of the prostate and urethra bilaterally.  The urethra was then sharply transected allowing the prostate specimen to be disarticulated. The pelvis was copiously irrigated and hemostasis was ensured. There was no evidence for rectal injury.  Attention then turned to the right pelvic sidewall. The fibrofatty tissue between the external iliac vein, confluence of the iliac vessels, hypogastric artery, and Cooper's ligament was dissected free from the pelvic sidewall with care to preserve the obturator nerve. Weck clips were used for lymphostasis and hemostasis. An identical procedure was performed on the contralateral side and the lymphatic packets were removed for permanent pathologic analysis.  Attention then turned to the urethral anastomosis. A 2-0 Vicryl slip knot was placed between Denonvillier's fascia, the posterior bladder neck, and the posterior urethra to reapproximate these structures. A double-armed 3-0 Monocryl suture was then used to perform a 360 running tension-free anastomosis between the bladder neck and urethra. A new urethral catheter was then placed into the bladder and irrigated. There were no blood clots within the bladder and the anastomosis appeared to be watertight. A #19 Blake drain was then brought through the left lateral 8  mm port site and positioned appropriately within the pelvis. It was secured to the skin with a nylon suture. The surgical cart was then undocked. The right lateral 12 mm port site was closed at the fascial level with a 0 Vicryl suture placed laparoscopically. All remaining ports were then removed under direct vision. The prostate specimen was removed intact within the Endopouch retrieval bag via the periumbilical camera port site. This fascial opening was closed with two running 0 PDS sutures. 0.25% Marcaine was then injected into all port sites and all incisions were reapproximated at the skin level with 4-0 Monocryl subcuticular sutures and Dermabond. The patient appeared to tolerate the procedure well and without complications. The patient was able to be extubated and transferred to the recovery unit in satisfactory condition.   Moody Bruins MD

## 2023-04-24 NOTE — Progress Notes (Signed)
Patient ID: Harold Rodriguez, male   DOB: 09-11-1970, 52 y.o.   MRN: [id number]  Post-op note  Subjective: The patient is doing well.  No complaints.  Objective: Vital signs in last 24 hours: Temp:  [97.7 F (36.5 C)-98.6 F (37 C)] 97.7 F (36.5 C) (11/21 1412) Pulse Rate:  [71-88] 77 (11/21 1530) Resp:  [10-18] 13 (11/21 1530) BP: (143-173)/(78-94) 160/87 (11/21 1530) SpO2:  [93 %-100 %] 98 % (11/21 1530) Weight:  [124.3 kg] 124.3 kg (11/21 0924)  Intake/Output from previous day: No intake/output data recorded. Intake/Output this shift: Total I/O In: 2150 [I.V.:2050; IV Piggyback:100] Out: 50 [Blood:50]  Physical Exam:  General: Alert and oriented. Abdomen: Soft, Nondistended. Incisions: Clean and dry. GU: Urine draining well.  Lab Results: Recent Labs    04/24/23 1434  HGB 13.9  HCT 43.1    Assessment/Plan: POD#0   1) Continue to monitor, ambulate, IS   Harold Rodriguez. MD   LOS: 0 days   Crecencio Mc 04/24/2023, 4:10 PM

## 2023-04-24 NOTE — Plan of Care (Signed)
  Problem: Education: Goal: Knowledge of the procedure and recovery process will improve 04/24/2023 2010 by Iva Boop, RN Outcome: Progressing 04/24/2023 1646 by Iva Boop, RN Outcome: Progressing   Problem: Bowel/Gastric: Goal: Gastrointestinal status for postoperative course will improve 04/24/2023 2010 by Iva Boop, RN Outcome: Progressing 04/24/2023 1646 by Iva Boop, RN Outcome: Progressing   Problem: Pain Management: Goal: General experience of comfort will improve 04/24/2023 2010 by Iva Boop, RN Outcome: Progressing 04/24/2023 1646 by Iva Boop, RN Outcome: Progressing   Problem: Skin Integrity: Goal: Demonstration of wound healing without infection will improve Outcome: Progressing   Problem: Education: Goal: Knowledge of General Education information will improve Description: Including pain rating scale, medication(s)/side effects and non-pharmacologic comfort measures Outcome: Progressing

## 2023-04-24 NOTE — Anesthesia Procedure Notes (Addendum)
Procedure Name: Intubation Date/Time: 04/24/2023 11:12 AM  Performed by: Florene Route, CRNAPre-anesthesia Checklist: Patient identified, Emergency Drugs available, Suction available and Patient being monitored Patient Re-evaluated:Patient Re-evaluated prior to induction Oxygen Delivery Method: Circle system utilized Preoxygenation: Pre-oxygenation with 100% oxygen Induction Type: IV induction Ventilation: Mask ventilation without difficulty and Oral airway inserted - appropriate to patient size Laryngoscope Size: Glidescope and 4 Grade View: Grade I Tube type: Oral Tube size: 8.0 mm Number of attempts: 1 Airway Equipment and Method: Stylet and Oral airway Placement Confirmation: ETT inserted through vocal cords under direct vision, positive ETCO2 and breath sounds checked- equal and bilateral Secured at: 22 cm Tube secured with: Tape Dental Injury: Teeth and Oropharynx as per pre-operative assessment  Comments: Glidescope per Morrie Sheldon, RN from [employer]

## 2023-04-25 ENCOUNTER — Encounter (HOSPITAL_COMMUNITY): Payer: Self-pay | Admitting: Urology

## 2023-04-25 DIAGNOSIS — C61 Malignant neoplasm of prostate: Secondary | ICD-10-CM | POA: Diagnosis not present

## 2023-04-25 LAB — HEMOGLOBIN AND HEMATOCRIT, BLOOD
HCT: 38 % — ABNORMAL LOW (ref 39.0–52.0)
Hemoglobin: 12.5 g/dL — ABNORMAL LOW (ref 13.0–17.0)

## 2023-04-25 MED ORDER — BISACODYL 10 MG RE SUPP
10.0000 mg | Freq: Once | RECTAL | Status: AC
  Administered 2023-04-25: 10 mg via RECTAL
  Filled 2023-04-25: qty 1

## 2023-04-25 MED ORDER — ORAL CARE MOUTH RINSE: 15.0000 mL | OROMUCOSAL | Status: DC | PRN

## 2023-04-25 MED ORDER — TRAMADOL HCL 50 MG PO TABS
50.0000 mg | ORAL_TABLET | Freq: Four times a day (QID) | ORAL | Status: DC | PRN
  Administered 2023-04-25: 50 mg via ORAL
  Filled 2023-04-25: qty 1

## 2023-04-25 NOTE — Progress Notes (Signed)
Patient ID: Harold Rodriguez, male   DOB: Feb 09, 1971, 52 y.o.   MRN: [id number]  1 Day Post-Op Subjective: The patient is doing well.  No nausea or vomiting. Pain is adequately controlled.  Objective: Vital signs in last 24 hours: Temp:  [97.6 F (36.4 C)-98.6 F (37 C)] 97.6 F (36.4 C) (11/22 0510) Pulse Rate:  [60-88] 60 (11/22 0510) Resp:  [10-19] 18 (11/22 0510) BP: (123-173)/(75-94) 123/75 (11/22 0510) SpO2:  [93 %-100 %] 97 % (11/22 0510) Weight:  [124.3 kg] 124.3 kg (11/21 0924)  Intake/Output from previous day: 11/21 0701 - 11/22 0700 In: 4097 [I.V.:2797; IV Piggyback:1300] Out: 2100 [Urine:1850; Drains:200; Blood:50] Intake/Output this shift: No intake/output data recorded.  Physical Exam:  General: Alert and oriented. CV: RRR Lungs: Clear bilaterally. GI: Soft, Nondistended. Incisions: Clean, dry, and intact Urine: Clear Extremities: Nontender, no erythema, no edema.  Lab Results: Recent Labs    04/24/23 1434 04/25/23 0405  HGB 13.9 12.5*  HCT 43.1 38.0*      Assessment/Plan: POD# 1 s/p robotic prostatectomy.  1) SL IVF 2) Ambulate, Incentive spirometry 3) Transition to oral pain medication 4) Dulcolax suppository 5) D/C pelvic drain 6) Plan for likely discharge later today   Moody Bruins. MD   LOS: 0 days   Crecencio Mc 04/25/2023, 7:41 AM

## 2023-04-25 NOTE — TOC CM/SW Note (Signed)
Transition of Care [facility]) - Inpatient Brief Assessment   Patient Details  Name: Harold Rodriguez MRN: [id number] Date of Birth: [birth year]  [facility] CM/SW Contact:    Howell Rucks, RN Phone Number: 04/25/2023, 11:35 AM   Clinical Narrative: Met with pt and spouse at bedside to introduce role of TOC/NCM and review for dc planning. Pt reports he has an established PCP and pharmacist in place, no current home care services or home DME, reports he feels safe returning home with support from is spouse, confirmed transportation is available at discharge. TOC Brief Assessment completed. No TOC needs identified.     Transition of Care Asessment: Insurance and Status: Insurance coverage has been reviewed Patient has primary care physician: Yes Home environment has been reviewed: resides in private residence with spouse Prior level of function:: Independent Prior/Current Home Services: No current home services Social Determinants of Health Reivew: SDOH reviewed no interventions necessary Readmission risk has been reviewed: Yes Transition of care needs: no transition of care needs at this time

## 2023-04-25 NOTE — Plan of Care (Signed)
Reviewed all discharge instructions with patient and caregiver including medications, catheter care, and follow up appointments.  Problem: Education: Goal: Knowledge of the procedure and recovery process will improve Outcome: Completed/Met   Problem: Activity: Goal: Risk for activity intolerance will decrease Outcome: Completed/Met   Problem: Nutrition: Goal: Adequate nutrition will be maintained Outcome: Completed/Met   Problem: Pain Management: Goal: General experience of comfort will improve Outcome: Completed/Met

## 2023-04-25 NOTE — Progress Notes (Signed)
Shift changed at 0300 on 04/25/23.  Agree with RN assessment.

## 2023-04-25 NOTE — Discharge Summary (Signed)
  Date of admission: 04/24/2023  Date of discharge: 04/25/2023  Admission diagnosis: Prostate Cancer  Discharge diagnosis: Prostate Cancer  History and Physical: For full details, please see admission history and physical. Briefly, Harold Rodriguez is a 52 y.o. gentleman with localized prostate cancer.  After discussing management/treatment options, he elected to proceed with surgical treatment.  Hospital Course: Rileigh Dudenhoeffer was taken to the operating room on 04/24/2023 and underwent a robotic assisted laparoscopic radical prostatectomy. He tolerated this procedure well and without complications. Postoperatively, he was able to be transferred to a regular hospital room following recovery from anesthesia.  He was able to begin ambulating the night of surgery. He remained hemodynamically stable overnight.  He had excellent urine output with appropriately minimal output from his pelvic drain and his pelvic drain was removed on POD #1.  He was transitioned to oral pain medication, tolerated a clear liquid diet, and had met all discharge criteria and was able to be discharged home later on POD#1.  Laboratory values:  Recent Labs    04/24/23 1434 04/25/23 0405  HGB 13.9 12.5*  HCT 43.1 38.0*    Disposition: Home  Discharge instruction: He was instructed to be ambulatory but to refrain from heavy lifting, strenuous activity, or driving. He was instructed on urethral catheter care.  Discharge medications:   Allergies as of 04/25/2023   No Known Allergies      Medication List     STOP taking these medications    aspirin EC 81 MG tablet   ibuprofen 200 MG tablet Commonly known as: ADVIL       TAKE these medications    allopurinol 100 MG tablet Commonly known as: ZYLOPRIM TAKE 1 TABLET BY MOUTH EVERY DAY   amLODipine 10 MG tablet Commonly known as: NORVASC TAKE 1 TABLET BY MOUTH EVERY DAY   docusate sodium 100 MG capsule Commonly known as: COLACE Take 1 capsule (100  mg total) by mouth 2 (two) times daily.   olmesartan 40 MG tablet Commonly known as: BENICAR Take 40 mg by mouth daily.   rosuvastatin 20 MG tablet Commonly known as: CRESTOR TAKE 1 TABLET BY MOUTH EVERY DAY   sulfamethoxazole-trimethoprim 800-160 MG tablet Commonly known as: BACTRIM DS Take 1 tablet by mouth 2 (two) times daily. Start the day prior to foley removal appointment   tadalafil 20 MG tablet Commonly known as: CIALIS Take 20 mg by mouth every other day as needed for erectile dysfunction.   traMADol 50 MG tablet Commonly known as: Ultram Take 1-2 tablets (50-100 mg total) by mouth every 6 (six) hours as needed for moderate pain (pain score 4-6) or severe pain (pain score 7-10).        Followup: He will followup in 1 week for catheter removal and to discuss his surgical pathology results.

## 2023-04-30 LAB — SURGICAL PATHOLOGY

## 2023-06-11 ENCOUNTER — Ambulatory Visit (INDEPENDENT_AMBULATORY_CARE_PROVIDER_SITE_OTHER): Payer: Self-pay

## 2023-06-11 ENCOUNTER — Encounter: Payer: Self-pay | Admitting: Podiatry

## 2023-06-11 ENCOUNTER — Ambulatory Visit (INDEPENDENT_AMBULATORY_CARE_PROVIDER_SITE_OTHER): Payer: Self-pay | Admitting: Podiatry

## 2023-06-11 VITALS — Ht 71.0 in | Wt 275.0 lb

## 2023-06-11 DIAGNOSIS — M7752 Other enthesopathy of left foot: Secondary | ICD-10-CM

## 2023-06-11 DIAGNOSIS — M778 Other enthesopathies, not elsewhere classified: Secondary | ICD-10-CM

## 2023-06-11 MED ORDER — TRIAMCINOLONE ACETONIDE 10 MG/ML IJ SUSP
10.0000 mg | Freq: Once | INTRAMUSCULAR | Status: AC
  Administered 2023-06-11: 10 mg via INTRA_ARTICULAR

## 2023-06-11 NOTE — Progress Notes (Signed)
 Subjective:   Patient ID: Harold Rodriguez, male   DOB: 53 y.o.   MRN: [id number]   HPI Patient states has had a lot of pain in his left ankle history of gout and did have a radical prostatectomy performed 6 weeks ago.  Due to go back to work does not smoke likes to be active   Review of Systems  All other systems reviewed and are negative.       Objective:  Physical Exam Vitals and nursing note reviewed.  Constitutional:      Appearance: He is well-developed.  Pulmonary:     Effort: Pulmonary effort is normal.  Musculoskeletal:        General: Normal range of motion.  Skin:    General: Skin is warm.  Neurological:     Mental Status: He is alert.     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  Placed on reduced activity for the next few days and hopefully this will solve the problem  X-rays indicate moderate diminishment of arch height no other pathology noted currently

## 2023-06-30 ENCOUNTER — Ambulatory Visit: Payer: Self-pay | Admitting: Podiatry

## 2023-12-09 ENCOUNTER — Ambulatory Visit: Admitting: Internal Medicine

## 2024-12-07 ENCOUNTER — Ambulatory Visit: Admitting: Internal Medicine
